# Patient Record
Sex: Female | Born: 1970
Health system: Southern US, Community
[De-identification: ages and names within clinical notes are randomized; demographics above are authoritative.]

## PROBLEM LIST (undated history)

## (undated) DIAGNOSIS — D069 Carcinoma in situ of cervix, unspecified: Secondary | ICD-10-CM

## (undated) DIAGNOSIS — Z801 Family history of malignant neoplasm of trachea, bronchus and lung: Secondary | ICD-10-CM

## (undated) DIAGNOSIS — Z808 Family history of malignant neoplasm of other organs or systems: Secondary | ICD-10-CM

## (undated) DIAGNOSIS — Z803 Family history of malignant neoplasm of breast: Secondary | ICD-10-CM

## (undated) DIAGNOSIS — Z800 Family history of malignant neoplasm of digestive organs: Secondary | ICD-10-CM

## (undated) DIAGNOSIS — Z8042 Family history of malignant neoplasm of prostate: Secondary | ICD-10-CM

## (undated) HISTORY — PX: AUGMENTATION MAMMAPLASTY: SUR837

## (undated) HISTORY — DX: Carcinoma in situ of cervix, unspecified: D06.9

## (undated) HISTORY — PX: LEEP: SHX91

## (undated) HISTORY — DX: Family history of malignant neoplasm of other organs or systems: Z80.8

## (undated) HISTORY — DX: Family history of malignant neoplasm of trachea, bronchus and lung: Z80.1

## (undated) HISTORY — DX: Family history of malignant neoplasm of breast: Z80.3

## (undated) HISTORY — DX: Family history of malignant neoplasm of prostate: Z80.42

## (undated) HISTORY — DX: Family history of malignant neoplasm of digestive organs: Z80.0

## (undated) HISTORY — PX: BREAST SURGERY: SHX581

---

## 1994-04-25 HISTORY — PX: WISDOM TOOTH EXTRACTION: SHX21

## 2009-04-25 HISTORY — PX: AUGMENTATION MAMMAPLASTY: SUR837

## 2010-04-25 HISTORY — PX: LEEP: SHX91

## 2010-04-25 HISTORY — PX: COLPOSCOPY: SHX161

## 2010-04-25 HISTORY — PX: CERVICAL BIOPSY  W/ LOOP ELECTRODE EXCISION: SUR135

## 2014-02-27 ENCOUNTER — Encounter: Payer: Self-pay | Admitting: Family Medicine

## 2014-02-27 ENCOUNTER — Ambulatory Visit (INDEPENDENT_AMBULATORY_CARE_PROVIDER_SITE_OTHER): Payer: BC Managed Care – PPO | Admitting: Family Medicine

## 2014-02-27 VITALS — BP 98/68 | HR 80 | Temp 98.6°F | Ht 65.25 in | Wt 157.1 lb

## 2014-02-27 DIAGNOSIS — Z7189 Other specified counseling: Secondary | ICD-10-CM

## 2014-02-27 DIAGNOSIS — Z7689 Persons encountering health services in other specified circumstances: Secondary | ICD-10-CM

## 2014-02-27 DIAGNOSIS — Z0000 Encounter for general adult medical examination without abnormal findings: Secondary | ICD-10-CM

## 2014-02-27 DIAGNOSIS — Z23 Encounter for immunization: Secondary | ICD-10-CM

## 2014-02-27 DIAGNOSIS — E663 Overweight: Secondary | ICD-10-CM

## 2014-02-27 DIAGNOSIS — Z8742 Personal history of other diseases of the female genital tract: Secondary | ICD-10-CM

## 2014-02-27 NOTE — Patient Instructions (Addendum)
BEFORE YOU LEAVE: -Tdap and flu vaccine  -schedule fasting lab appointment  Yearly gynecological exams and mammograms  Vitamin D3 [phone] IU dialy; 1200mg  calcium from diet +/- supplement only if inadequate intake in diet  We recommend the following healthy lifestyle measures: - eat a healthy diet consisting of lots of vegetables, fruits, beans, nuts, seeds, healthy meats such as white chicken and fish and whole grains.  - avoid fried foods, fast food, processed foods, sodas, red meet and other fattening foods.  - get a least 150 minutes of aerobic exercise per week.

## 2014-02-27 NOTE — Progress Notes (Signed)
HPI:  Alyssa Simpson is here to establish care. Moved to [place] in the last few years.  Last PCP and physical: pap this summer normal - abnormal pap in the past and had LEEP in 2012. She was seeing a gynecologist. She has a family hx of breast cancer and is doing annual mammograms and breast exams with her gynecologist. She has had a mild weight gain the last few years but is working on a healthy diet and regular exercise. ROS negative for unless reported above: fevers, unintentional weight loss, hearing or vision loss, chest pain, palpitations, struggling to breath, hemoptysis, melena, hematochezia, hematuria, falls, loc, si, thoughts of self harm  Past Medical History  Diagnosis Date  . Heavy menstrual bleeding     Past Surgical History  Procedure Laterality Date  . Leep      2012 - reports cervical ca  . Breast surgery      breast augmentation 2011    Family History  Problem Relation Age of Onset  . Hyperlipidemia Father   . Diabetes Father   . Hypertension Father   . Osteoporosis Mother   . Breast cancer Sister   . Thyroid cancer Sister     History   Social History  . Marital Status: Married    Spouse Name: N/A    Number of Children: N/A  . Years of Education: N/A   Social History Main Topics  . Smoking status: Never Smoker   . Smokeless tobacco: None  . Alcohol Use: 0.0 oz/week    0 Not specified per week     Comment: 1-2 drinks most days  . Drug Use: None  . Sexual Activity: None   Other Topics Concern  . None   Social History Narrative   Work or School: Production designer, theatre/television/film      Home Situation: Lives with husband      Spiritual Beliefs: Christian      Lifestyle: exercises at the gym 4 days per week; diet is healthy          Current outpatient prescriptions: norethindrone-ethinyl estradiol-iron (MICROGESTIN FE,GILDESS FE,LOESTRIN FE) 1.5-30 MG-MCG tablet, Take 1 tablet by mouth daily., Disp: , Rfl:   EXAM:  Filed Vitals:   02/27/14 1430   BP: 98/68  Pulse: 80  Temp: 98.6 F (37 C)    Body mass index is 25.95 kg/(m^2).  GENERAL: vitals reviewed and listed above, alert, oriented, appears well hydrated and in no acute distress  HEENT: atraumatic, conjunttiva clear, no obvious abnormalities on inspection of external nose and ears  NECK: no obvious masses on inspection  LUNGS: clear to auscultation bilaterally, no wheezes, rales or rhonchi, good air movement  CV: HRRR, no peripheral edema  MS: moves all extremities without noticeable abnormality  PSYCH: pleasant and cooperative, no obvious depression or anxiety  ASSESSMENT AND PLAN:  Discussed the following assessment and plan:  Encounter to establish care  Overweight  Hx of abnormal cervical Pap smear  Visit for preventive health examination - Plan: Lipid Panel, Hemoglobin A1c  -USPSTF level a and b recs discussed -Tdap and flu shot  -FASTING labs -We reviewed the PMH, PSH, FH, SH, Meds and Allergies. -We provided refills for any medications we will prescribe as needed. -We addressed current concerns per orders and patient instructions. -We have asked for records for pertinent exams, studies, vaccines and notes from previous providers. -We have advised patient to follow up per instructions below.   -Patient advised to return or notify a doctor  immediately if symptoms worsen or persist or new concerns arise.  Patient Instructions  BEFORE YOU LEAVE: -Tdap and flu vaccine  -schedule fasting lab appointment  Yearly gynecological exams and mammograms  Vitamin D3 [phone] IU dialy; 1200mg  calcium from diet +/- supplement only if inadequate intake in diet  We recommend the following healthy lifestyle measures: - eat a healthy diet consisting of lots of vegetables, fruits, beans, nuts, seeds, healthy meats such as white chicken and fish and whole grains.  - avoid fried foods, fast food, processed foods, sodas, red meet and other fattening foods.  - get  a least 150 minutes of aerobic exercise per week.       Colin Benton R.

## 2014-02-27 NOTE — Addendum Note (Signed)
Addended by: Agnes Lawrence on: 02/27/2014 05:07 PM   Modules accepted: Orders

## 2014-02-27 NOTE — Progress Notes (Signed)
Pre visit review using our clinic review tool, if applicable. No additional management support is needed unless otherwise documented below in the visit note. 

## 2014-03-14 ENCOUNTER — Other Ambulatory Visit (INDEPENDENT_AMBULATORY_CARE_PROVIDER_SITE_OTHER): Payer: BC Managed Care – PPO

## 2014-03-14 DIAGNOSIS — Z0000 Encounter for general adult medical examination without abnormal findings: Secondary | ICD-10-CM

## 2014-03-14 LAB — LIPID PANEL
CHOL/HDL RATIO: 4
Cholesterol: 195 mg/dL (ref 0–200)
HDL: 52.2 mg/dL (ref >39.00)
LDL Cholesterol: 128 mg/dL — ABNORMAL HIGH (ref 0–99)
NonHDL: 142.8
Triglycerides: 76 mg/dL (ref 0.0–149.0)
VLDL: 15.2 mg/dL (ref 0.0–40.0)

## 2014-03-14 LAB — HEMOGLOBIN A1C: Hgb A1c MFr Bld: 5.2 % (ref 4.6–6.5)

## 2015-02-19 ENCOUNTER — Telehealth: Payer: Self-pay | Admitting: Internal Medicine

## 2015-02-19 NOTE — Telephone Encounter (Signed)
Patient Name: Alyssa Simpson  DOB: Jun 01, 1970    Initial Comment Caller states she has been exposed to a co-worker with whooping cough, no symptoms.   Nurse Assessment  Nurse: Raphael Gibney, RN, Vanita Ingles Date/Time Eilene Ghazi Time): 02/19/2015 2:05:21 PM  Confirm and document reason for call. If symptomatic, describe symptoms. 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 No cough or fever. Co worker was diagnosed today. Not even working in the same room.  Has the patient traveled out of the country within the last 30 days? ---Not Applicable  Does the patient have any new or worsening symptoms? ---Yes  Will a triage be completed? ---Yes  Related visit to physician within the last 2 weeks? ---No  Does the PT have any chronic conditions? (i.e. diabetes, asthma, etc.) ---No  Did the patient indicate they were pregnant? ---No     Guidelines    Guideline Title Affirmed Question Affirmed Notes  Whooping Cough Exposure [1] Whooping cough EXPOSURE < 22 days ago AND [2] NO cough or difficulty breathing    Final Disposition User   Call PCP within 37 Hours [name], RN, [name] would like call back whether she needs to take antibiotics as she has no symptoms and whether she needs to get pertussis immunization. Does not want to make appt unless necessary.   Referrals  REFERRED TO PCP OFFICE   Disagree/Comply: Disagree  Disagree/Comply Reason: Disagree with instructions

## 2015-02-20 NOTE — Telephone Encounter (Signed)
Coworker whom she was not in same room with had whooping cough 30 days ago with mild symptoms. Alyssa Simpson has not symptoms, is not pregnant, has no chronic diseases and does not have close contact with infants. She had  her Tdap less then 1 year ago. Opted against any prophylactic treatment. Discussed risks, transmission, prevention.

## 2015-02-20 NOTE — Telephone Encounter (Signed)
Dr. Maudie Mercury,  please see message and advise if pt needs antibiotic or what treatment.

## 2015-02-26 ENCOUNTER — Encounter: Payer: Self-pay | Admitting: Obstetrics and Gynecology

## 2015-02-26 ENCOUNTER — Other Ambulatory Visit: Payer: Self-pay

## 2015-02-26 ENCOUNTER — Ambulatory Visit (INDEPENDENT_AMBULATORY_CARE_PROVIDER_SITE_OTHER): Payer: BLUE CROSS/BLUE SHIELD | Admitting: Obstetrics and Gynecology

## 2015-02-26 VITALS — BP 102/60 | HR 80 | Resp 14 | Wt 161.0 lb

## 2015-02-26 DIAGNOSIS — D069 Carcinoma in situ of cervix, unspecified: Secondary | ICD-10-CM | POA: Insufficient documentation

## 2015-02-26 DIAGNOSIS — Z124 Encounter for screening for malignant neoplasm of cervix: Secondary | ICD-10-CM

## 2015-02-26 DIAGNOSIS — R635 Abnormal weight gain: Secondary | ICD-10-CM | POA: Diagnosis not present

## 2015-02-26 DIAGNOSIS — Z01419 Encounter for gynecological examination (general) (routine) without abnormal findings: Secondary | ICD-10-CM

## 2015-02-26 DIAGNOSIS — Z0000 Encounter for general adult medical examination without abnormal findings: Secondary | ICD-10-CM

## 2015-02-26 DIAGNOSIS — Z1231 Encounter for screening mammogram for malignant neoplasm of breast: Secondary | ICD-10-CM

## 2015-02-26 NOTE — Progress Notes (Signed)
Patient ID: Alyssa Simpson, female   DOB: Sep 25, 1970, 44 y.o.   MRN: [id number] 44 y.o. [id number] MarriedCaucasianF here for annual exam.  She used to be on OCP's for cycle control, gained weight on the pill. She has gained 10 lb a year x 3 years. She stopped the pill in February. Menses q month, normal flow. She will stay off the pill. Vasectomy for contraception, no dyspareunia.  Period Cycle (Days): 28 Period Duration (Days): 5 days  Period Pattern: Regular Menstrual Flow: Heavy, Moderate Menstrual Control: Tampon, Maxi pad Dysmenorrhea: None  Patient's last menstrual period was 02/24/2015.          Sexually active: Yes.    The current method of family planning is vasectomy.    Exercising: Yes.    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 She has never used smokeless tobacco. She reports that she drinks about 0.6 - 1.2 oz of alcohol per week. She reports that she does not use illicit drugs. 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 Son is at [employer] state, older son is a IT trainer.   Past Medical History  Diagnosis Date  . Heavy menstrual bleeding     Past Surgical History  Procedure Laterality Date  . Leep      2012 - reports cervical ca  . Breast surgery      breast augmentation 2011  . Colposcopy  2012   . Cervical biopsy  w/ loop electrode excision  2012    Current Outpatient Prescriptions  Medication Sig Dispense Refill  . norethindrone-ethinyl estradiol-iron (MICROGESTIN FE,GILDESS FE,LOESTRIN FE) 1.5-30 MG-MCG tablet Take 1 tablet by mouth daily.     No current facility-administered medications for this visit.    Family History  Problem Relation Age of Onset  . Hyperlipidemia Father   . Diabetes Father    . Hypertension Father   . Osteoporosis Mother   . Breast cancer Sister   . Thyroid cancer Sister   . Heart failure Maternal Uncle     Review of Systems  Constitutional: Positive for unexpected weight change.  HENT: Negative.   Eyes: Negative.   Respiratory: Negative.   Cardiovascular: Negative.   Gastrointestinal: Negative.   Endocrine: Negative.   Genitourinary: Negative.   Musculoskeletal: Negative.   Skin: Negative.   Allergic/Immunologic: Negative.   Neurological: Negative.   Psychiatric/Behavioral: Negative.     Exam:   BP 102/60 mmHg  Pulse 80  Resp 14  Wt 161 lb (73.029 kg)  LMP 02/24/2015  Weight change: @WEIGHTCHANGE @ Height:      Ht Readings from Last 3 Encounters:  02/27/14 5' 5.25" (1.657 m)    General appearance: alert, cooperative and appears stated age Head: Normocephalic, without obvious abnormality, atraumatic Neck: no adenopathy, supple, symmetrical, trachea midline and thyroid normal to inspection and palpation Lungs: clear to auscultation bilaterally Breasts: normal appearance, no masses or tenderness, bilateral implants Heart: regular rate and rhythm Abdomen: soft, non-tender; bowel sounds normal; no masses,  no organomegaly Extremities: extremities normal, atraumatic, no cyanosis or edema Skin: Skin color, texture, turgor normal. No rashes or lesions Lymph nodes: Cervical, supraclavicular, and axillary nodes normal. No abnormal inguinal nodes palpated Neurologic: Grossly  normal   Pelvic: External genitalia:  no lesions              Urethra:  normal appearing urethra with no masses, tenderness or lesions              Bartholins and Skenes: normal                 Vagina: normal appearing vagina with normal color and discharge, no lesions              Cervix: no lesions               Bimanual Exam:  Uterus:  normal size, contour, position, consistency, mobility, non-tender              Adnexa: no mass, fullness, tenderness                Rectovaginal: Confirms               Anus:  normal sphincter tone, no lesions  Chaperone was present for exam.  A:  Well Woman with normal exam  H/O leep in 2012 for CIN 2-3   P:   Pap with hpv  Mammogram  Discussed breast self exams  Discussed calcium and vit D, on calcium now

## 2015-02-26 NOTE — Patient Instructions (Signed)

## 2015-03-02 ENCOUNTER — Ambulatory Visit (INDEPENDENT_AMBULATORY_CARE_PROVIDER_SITE_OTHER): Payer: BLUE CROSS/BLUE SHIELD | Admitting: Family Medicine

## 2015-03-02 ENCOUNTER — Encounter: Payer: Self-pay | Admitting: Family Medicine

## 2015-03-02 ENCOUNTER — Other Ambulatory Visit (INDEPENDENT_AMBULATORY_CARE_PROVIDER_SITE_OTHER): Payer: BLUE CROSS/BLUE SHIELD

## 2015-03-02 VITALS — BP 98/70 | HR 92 | Temp 98.1°F | Ht 65.25 in | Wt 160.2 lb

## 2015-03-02 DIAGNOSIS — Z23 Encounter for immunization: Secondary | ICD-10-CM | POA: Diagnosis not present

## 2015-03-02 DIAGNOSIS — R635 Abnormal weight gain: Secondary | ICD-10-CM

## 2015-03-02 DIAGNOSIS — Z0000 Encounter for general adult medical examination without abnormal findings: Secondary | ICD-10-CM

## 2015-03-02 DIAGNOSIS — J069 Acute upper respiratory infection, unspecified: Secondary | ICD-10-CM

## 2015-03-02 LAB — TSH: TSH: 0.985 u[IU]/mL (ref 0.350–4.500)

## 2015-03-02 LAB — LIPID PANEL
Cholesterol: 169 mg/dL (ref 125–200)
HDL: 71 mg/dL (ref >=46)
LDL CALC: 83 mg/dL (ref <130)
TRIGLYCERIDES: 73 mg/dL (ref <150)
Total CHOL/HDL Ratio: 2.4 Ratio (ref <=5.0)
VLDL: 15 mg/dL (ref <30)

## 2015-03-02 LAB — VITAMIN D 25 HYDROXY (VIT D DEFICIENCY, FRACTURES): Vit D, 25-Hydroxy: 23 ng/mL — ABNORMAL LOW (ref 30–100)

## 2015-03-02 MED ORDER — AZITHROMYCIN 250 MG PO TABS: ORAL_TABLET | ORAL | Status: DC

## 2015-03-02 NOTE — Progress Notes (Signed)
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  Past Medical History  Diagnosis Date  . CIN III (cervical intraepithelial neoplasia III)     Past Surgical History  Procedure Laterality Date  . Leep      2012 - CIN 2-3  . Breast surgery      breast augmentation 2011  . Colposcopy  2012   . Cervical biopsy  w/ loop electrode excision  2012    Family History  Problem Relation Age of Onset  . Hyperlipidemia Father   . Diabetes Father   . Hypertension Father   . Osteoporosis Mother   . Breast cancer Sister   . Thyroid cancer Sister   . Heart failure Maternal Uncle     Social History   Social History  . Marital Status: Married    Spouse Name: N/A  . Number of Children: N/A  . Years of Education: N/A   Social History Main Topics  . Smoking status: Never Smoker   . Smokeless tobacco: Never Used  . Alcohol Use: 0.6 - 1.2 oz/week    1-2 Standard drinks or equivalent per week     Comment: 1-2 drinks most days  . Drug Use: No  . Sexual Activity:    Partners: Male   Other Topics Concern  . None   Social History Narrative   Work or School: Production designer, theatre/television/film      Home Situation: Lives with husband      Spiritual Beliefs: Christian      Lifestyle: exercises at the gym 4 days per week; diet is healthy           Current outpatient prescriptions:  .  Calcium Carbonate-Vitamin D (CALCIUM 500 + D PO), Take by mouth., Disp: , Rfl:  .  Cyanocobalamin (VITAMIN B 12 PO), Take by mouth., Disp: , Rfl:  .  azithromycin (ZITHROMAX) 250 MG tablet, 2  tabs day 1, then one tab daily, Disp: 6 tablet, Rfl: 0  EXAM:  Filed Vitals:   03/02/15 1007  BP: 98/70  Pulse: 92  Temp: 98.1 F (36.7 C)    Body mass index is 26.47 kg/(m^2).  GENERAL: vitals reviewed and listed above, alert, oriented, appears well hydrated and in no acute distress  HEENT: atraumatic, conjunttiva clear, no obvious abnormalities on inspection of external nose and ears, normal appearance of ear canals and TMs, clear nasal congestion, mild post oropharyngeal erythema with PND, no tonsillar edema or exudate, no sinus TTP  NECK: no obvious masses on inspection  LUNGS: clear to auscultation bilaterally, no wheezes, rales or rhonchi, good air movement  CV: HRRR, no peripheral edema  MS: moves all extremities without noticeable abnormality  PSYCH: pleasant and cooperative, no obvious depression or anxiety  ASSESSMENT AND PLAN:  Discussed the following assessment and plan:  Acute upper respiratory infection  -given HPI and exam findings today, a serious infection or illness is unlikely. We discussed potential etiologies, with VURI being most likely, and advised supportive care and monitoring. We discussed treatment side effects, likely course, antibiotic misuse, transmission, and signs of  developing a serious illness. -abx given sinus pain and travel -pertussis very unlikely given not exposed and Tdap in last 1 year, but advised testing given upper resp symptoms - she declined -of course, we advised to return or notify a doctor immediately if symptoms worsen or persist or new concerns arise.    Patient Instructions  INSTRUCTIONS FOR UPPER RESPIRATORY INFECTION:  -plenty of rest and fluids  -nasal saline wash 2-3 times daily (use prepackaged nasal saline or bottled/distilled water if making your own)   -can use AFRIN nasal spray for drainage and nasal congestion - but do NOT use longer then 3-4 days  -can use tylenol (in no history of liver disease) or  ibuprofen (if no history of kidney disease, bowel bleeding or significant heart disease) as directed for aches and sorethroat  -in the winter time, using a humidifier at night is helpful (please follow cleaning instructions)  -if you are taking a cough medication - use only as directed, may also try a teaspoon of honey to coat the throat and throat lozenges. If given a cough medication with codeine or hydrocodone or other narcotic please be advised that this contains a strong and  potentially addicting medication. Please follow instructions carefully, take as little as possible and only use AS NEEDED for severe cough. Discuss potential side effects with your pharmacy. Please do not drive or operate machinery while taking these types of medications. Please do not take other sedating medications, drugs or alcohol while taking this medication without discussing with your doctor.  -for sore throat, salt water gargles can help  -follow up if you have fevers, facial pain, tooth pain, difficulty breathing or are worsening or symptoms persist longer then expected  Upper Respiratory Infection, Adult An upper respiratory infection (URI) is also known as the common cold. It is often caused by a type of germ (virus). Colds are easily spread (contagious). You can pass it to others by kissing, coughing, sneezing, or drinking out of the same glass. Usually, you get better in 1 to 3  weeks.  However, the cough can last for even longer. HOME CARE   Only take medicine as told by your doctor. Follow instructions provided above.  Drink enough water and fluids to keep your pee (urine) clear or pale yellow.  Get plenty of rest.  Return to work when your temperature is < 100 for 24 hours or as told by your doctor. You may use a face mask and wash your hands to stop your cold from spreading. GET HELP RIGHT AWAY IF:   After the first few days, you feel you are getting worse.  You have questions about your  medicine.  You have chills, shortness of breath, or red spit (mucus).  You have pain in the face for more then 1-2 days, especially when you bend forward.  You have a fever, puffy (swollen) neck, pain when you swallow, or white spots in the back of your throat.  You have a bad headache, ear pain, sinus pain, or chest pain.  You have a high-pitched whistling sound when you breathe in and out (wheezing).  You cough up blood.  You have sore muscles or a stiff neck. MAKE SURE YOU:   Understand these instructions.  Will watch your condition.  Will get help right away if you are not doing well or get worse. Document Released: 09/28/2007 Document Revised: 07/04/2011 Document Reviewed: 07/17/2013 [facility] Patient Information 2015 [place], [place]. This information is not intended to replace advice  given to you by your health care provider. Make sure you discuss any questions you have with your health care provider.      Colin Benton R.

## 2015-03-02 NOTE — Progress Notes (Signed)
Pre visit review using our clinic review tool, if applicable. No additional management support is needed unless otherwise documented below in the visit note. 

## 2015-03-02 NOTE — Patient Instructions (Signed)

## 2015-03-03 ENCOUNTER — Telehealth: Payer: Self-pay | Admitting: *Deleted

## 2015-03-03 LAB — IPS PAP TEST WITH HPV

## 2015-03-03 NOTE — Telephone Encounter (Signed)
LMTC for lab results -eh  

## 2015-03-03 NOTE — Telephone Encounter (Signed)
I spoke with patient and gave her lab results-eh

## 2015-03-03 NOTE — Telephone Encounter (Signed)
Patient is returning a call to Elaine. °

## 2015-03-03 NOTE — Telephone Encounter (Signed)
-----   Message from Salvadore Dom, MD sent at 03/03/2015  7:18 AM EST ----- Please inform the patient that her vit D level is low, the rest of her blood work is normal. She should start taking 1,000 IU a day of vit D.

## 2015-03-09 ENCOUNTER — Telehealth: Payer: Self-pay | Admitting: Emergency Medicine

## 2015-03-09 MED ORDER — METRONIDAZOLE 0.75 % VA GEL: 1.0000 | Freq: Two times a day (BID) | VAGINAL | Status: DC

## 2015-03-09 NOTE — Telephone Encounter (Signed)
Message left to return call to [place] at [phone].   02 Recall entered.

## 2015-03-09 NOTE — Telephone Encounter (Signed)
Patient returning call.

## 2015-03-09 NOTE — Telephone Encounter (Signed)
-----   Message from Salvadore Dom, MD sent at 03/06/2015  9:28 AM EST ----- 02 Please inform the patient that her pap was + for BV and if she is symptomatic treat with flagyl (either oral or vaginal, her choice), no ETOH while on Flagyl.  Oral: Flagyl 500 mg BID x 7 days, or Vaginal: Metrogel, 1 applicator per vagina q day x 5 days. If not symptomatic she doesn't need to be treated

## 2015-03-09 NOTE — Telephone Encounter (Signed)
Returned call to patient. She states she has been having some intermittent vaginal discharge and odor. Has been treated for bacterial vaginosis previously.  Requests Metrogel to pharmacy. Instructions given. She verbalized understanding of instructions and will follow up as needed or for any other concerns.Routing to provider for final review. Patient agreeable to disposition. Will close encounter.

## 2015-03-11 ENCOUNTER — Other Ambulatory Visit: Payer: Self-pay

## 2015-03-31 ENCOUNTER — Telehealth: Payer: Self-pay | Admitting: Family Medicine

## 2015-03-31 NOTE — Telephone Encounter (Signed)
Mother: Tyrah Aceves - Provider: Dr. Maudie Mercury - DOB: 9.[birth year].72 Father: Shirlyn Goltz - Provider: Dr. Elease Hashimoto - DOB: [birth year].70  Both are wondering if either Dr. Maudie Mercury or Dr. Elease Hashimoto will take on their daughter Alyssa Simpson, dob: 07-01-95 as a new patient. They think too that she may have Bronchitis. She'll be home from college the evening of 12.7.16. Please give them a call and let them know if this will be possible.  [facility] ph# [phone] Thank you.

## 2015-04-01 NOTE — Telephone Encounter (Signed)
Pt mom is calling back to let us know that appointment is not needed for her daughter she went to [facility].

## 2015-04-01 NOTE — Telephone Encounter (Signed)
See prior note

## 2015-04-01 NOTE — Telephone Encounter (Signed)
Dr. Elease Hashimoto is not accepting any more new patient. Please advise if you would be willing to accept this new patient.

## 2015-04-01 NOTE — Telephone Encounter (Signed)
If she is not ok with seeing one of our providers taking new patients Yong Channel) - Ok with me for NPV in available 30 minute slot.

## 2015-04-01 NOTE — Telephone Encounter (Signed)
I left a message for Laurie that Alyssa Simpson has an appt available tomorrow at 10am if she wants to get Alyssa Simpson in as a new patient. Thanks to all of you.

## 2015-04-01 NOTE — Telephone Encounter (Signed)
FYI

## 2015-04-06 ENCOUNTER — Ambulatory Visit
Admission: RE | Admit: 2015-04-06 | Discharge: 2015-04-06 | Disposition: A | Discharge status: Home / Self Care | Payer: BLUE CROSS/BLUE SHIELD | Source: Ambulatory Visit

## 2015-04-06 DIAGNOSIS — Z1231 Encounter for screening mammogram for malignant neoplasm of breast: Secondary | ICD-10-CM

## 2015-04-07 ENCOUNTER — Encounter: Payer: Self-pay | Admitting: Family Medicine

## 2015-07-01 ENCOUNTER — Encounter (HOSPITAL_COMMUNITY): Payer: Self-pay

## 2015-07-01 ENCOUNTER — Telehealth: Payer: Self-pay | Admitting: Family Medicine

## 2015-07-01 ENCOUNTER — Emergency Department (HOSPITAL_COMMUNITY): Payer: BLUE CROSS/BLUE SHIELD

## 2015-07-01 ENCOUNTER — Emergency Department (HOSPITAL_COMMUNITY)
Admission: EM | Admit: 2015-07-01 | Discharge: 2015-07-02 | Disposition: A | Discharge status: Home / Self Care | Payer: BLUE CROSS/BLUE SHIELD | Attending: Emergency Medicine | Admitting: Emergency Medicine

## 2015-07-01 DIAGNOSIS — Z86001 Personal history of in-situ neoplasm of cervix uteri: Secondary | ICD-10-CM | POA: Insufficient documentation

## 2015-07-01 DIAGNOSIS — Z792 Long term (current) use of antibiotics: Secondary | ICD-10-CM | POA: Diagnosis not present

## 2015-07-01 DIAGNOSIS — R202 Paresthesia of skin: Secondary | ICD-10-CM | POA: Diagnosis not present

## 2015-07-01 DIAGNOSIS — F419 Anxiety disorder, unspecified: Secondary | ICD-10-CM | POA: Insufficient documentation

## 2015-07-01 DIAGNOSIS — R079 Chest pain, unspecified: Secondary | ICD-10-CM | POA: Diagnosis present

## 2015-07-01 DIAGNOSIS — R0602 Shortness of breath: Secondary | ICD-10-CM | POA: Diagnosis not present

## 2015-07-01 DIAGNOSIS — R200 Anesthesia of skin: Secondary | ICD-10-CM | POA: Insufficient documentation

## 2015-07-01 LAB — BASIC METABOLIC PANEL
Anion gap: 9 (ref 5–15)
BUN: 16 mg/dL (ref 6–20)
CALCIUM: 10.1 mg/dL (ref 8.9–10.3)
CO2: 28 mmol/L (ref 22–32)
CREATININE: 0.74 mg/dL (ref 0.44–1.00)
Chloride: 105 mmol/L (ref 101–111)
GFR calc Af Amer: >60 mL/min (ref >60)
Glucose, Bld: 99 mg/dL (ref 65–99)
POTASSIUM: 4.4 mmol/L (ref 3.5–5.1)
SODIUM: 142 mmol/L (ref 135–145)

## 2015-07-01 LAB — CBC
HCT: 42.3 % (ref 36.0–46.0)
Hemoglobin: 13.8 g/dL (ref 12.0–15.0)
MCH: 30.2 pg (ref 26.0–34.0)
MCHC: 32.6 g/dL (ref 30.0–36.0)
MCV: 92.6 fL (ref 78.0–100.0)
PLATELETS: 340 10*3/uL (ref 150–400)
RBC: 4.57 MIL/uL (ref 3.87–5.11)
RDW: 12.8 % (ref 11.5–15.5)
WBC: 9 10*3/uL (ref 4.0–10.5)

## 2015-07-01 LAB — I-STAT TROPONIN, ED
TROPONIN I, POC: 0.01 ng/mL (ref 0.00–0.08)
TROPONIN I, POC: 0 ng/mL (ref 0.00–0.08)

## 2015-07-01 NOTE — Discharge Instructions (Signed)
Please read and follow all provided instructions.  Your diagnoses today include:  1. Chest pain, unspecified chest pain type    Tests performed today include:  An EKG of your heart  A chest x-ray  Cardiac enzymes - a blood test for heart muscle damage  Blood counts and electrolytes  Vital signs. See below for your results today.   Medications prescribed:   None  Take any prescribed medications only as directed.  Follow-up instructions: Please follow-up with the cardiologist listed and your primary care provider as soon as you can for further evaluation of your symptoms.   Return instructions:  SEEK IMMEDIATE MEDICAL ATTENTION IF:  You have severe chest pain, especially if the pain is crushing or pressure-like and spreads to the arms, back, neck, or jaw, or if you have sweating, nausea (feeling sick to your stomach), or shortness of breath. THIS IS AN EMERGENCY. Don't wait to see if the pain will go away. Get medical help at once. Call 911 or 0 (operator). DO NOT drive yourself to the hospital.   Your chest pain gets worse and does not go away with rest.   You have an attack of chest pain lasting longer than usual, despite rest and treatment with the medications your caregiver has prescribed.   You wake from sleep with chest pain or shortness of breath.  You feel dizzy or faint.  You have chest pain not typical of your usual pain for which you originally saw your caregiver.   You have any other emergent concerns regarding your health.  Additional Information: Chest pain comes from many different causes. Your caregiver has diagnosed you as having chest pain that is not specific for one problem, but does not require admission.  You are at low risk for an acute heart condition or other serious illness.   Your vital signs today were: BP 104/75 mmHg   Pulse 83   Temp(Src) 98.7 F (37.1 C) (Oral)   Resp 16   Ht 5\' 7"  (1.702 m)   Wt 70.308 kg   BMI 24.27 kg/m2   SpO2 98%   LMP  06/23/2015 If your blood pressure (BP) was elevated above 135/85 this visit, please have this repeated by your doctor within one month. --------------

## 2015-07-01 NOTE — ED Provider Notes (Signed)
CSN: [id number]     Arrival date & time 07/01/15  1705 History   First MD Initiated Contact with Patient 07/01/15 2240     Chief Complaint  Patient presents with  . Chest Pain     (Consider location/radiation/quality/duration/timing/severity/associated sxs/prior Treatment) HPI Comments: Patient presents with complaint of chest pain 2 over the past 2 days. Patient does not have any significant past medical history. Father had diabetes and angina in his 34s. Patient describes a pressure in her chest lasting approximately 20 seconds after running up a flight of stairs yesterday. She stated that she was scared and had tingling in her arms and shortness of breath for approximately 4-5 minutes afterwards. No associated nausea or vomiting, diaphoresis, radiation of pain, palpitations, lightheadedness or syncope. Today patient was working in her yard and had acute onset of chest pressure again lasting a short period of time. This occurred approximately an hour prior to arrival. Symptoms were similar to the previous day. Patient called her primary care physician who recommended that she go to the emergency department for evaluation. She's never had symptoms like this in the past. She denies fever, cough, abdominal pain or urinary symptoms. No treatments prior to arrival. Patient denies risk factors for pulmonary embolism including: unilateral leg swelling, history of DVT/PE/other blood clots, use of exogenous hormones, recent immobilizations, recent surgery, recent travel (>4hr segment), malignancy, hemoptysis. The onset of this condition was acute. The course is resolved. Aggravating factors: none. Alleviating factors: none.    The history is provided by the patient.    Past Medical History  Diagnosis Date  . CIN III (cervical intraepithelial neoplasia III)    Past Surgical History  Procedure Laterality Date  . Leep      2012 - CIN 2-3  . Breast surgery      breast augmentation 2011  . Colposcopy   2012   . Cervical biopsy  w/ loop electrode excision  2012   Family History  Problem Relation Age of Onset  . Hyperlipidemia Father   . Diabetes Father   . Hypertension Father   . Osteoporosis Mother   . Breast cancer Sister   . Thyroid cancer Sister   . Heart failure Maternal Uncle    Social History  Substance Use Topics  . Smoking status: Never Smoker   . Smokeless tobacco: Never Used  . Alcohol Use: 0.6 - 1.2 oz/week    1-2 Standard drinks or equivalent per week     Comment: 1-2 drinks most days   OB History    Gravida Para Term Preterm AB TAB SAB Ectopic Multiple Living   2 2 2       2      Review of Systems  Constitutional: Negative for fever and diaphoresis.  Eyes: Negative for redness.  Respiratory: Positive for shortness of breath. Negative for cough.   Cardiovascular: Positive for chest pain. Negative for palpitations and leg swelling.  Gastrointestinal: Negative for nausea, vomiting and abdominal pain.  Genitourinary: Negative for dysuria.  Musculoskeletal: Negative for back pain and neck pain.  Skin: Negative for rash.  Neurological: Positive for numbness (tingling in arms). Negative for syncope and light-headedness.  Psychiatric/Behavioral: The patient is nervous/anxious.       Allergies  Poison ivy extract  Home Medications   Prior to Admission medications   Medication Sig Start Date End Date Taking? Authorizing Provider  azithromycin (ZITHROMAX) 250 MG tablet 2 tabs day 1, then one tab daily 03/02/15   Lucretia Kern, DO  Calcium Carbonate-Vitamin D (CALCIUM 500 + D PO) Take by mouth.    Historical Provider, MD  Cyanocobalamin (VITAMIN B 12 PO) Take by mouth.    Historical Provider, MD  metroNIDAZOLE (METROGEL) 0.75 % vaginal gel Place 1 Applicatorful vaginally 2 (two) times daily. Place one applicator full PV at hs for 5 nights. 03/09/15   Megan Salon, MD   BP 104/75 mmHg  Pulse 83  Temp(Src) 98.7 F (37.1 C) (Oral)  Resp 16  Ht 5\' 7"  (1.702 m)   Wt 70.308 kg  BMI 24.27 kg/m2  SpO2 98%  LMP 06/23/2015 Physical Exam  Constitutional: She appears well-developed and well-nourished.  HENT:  Head: Normocephalic and atraumatic.  Mouth/Throat: Mucous membranes are normal. Mucous membranes are not dry.  Eyes: Conjunctivae are normal.  Neck: Trachea normal and normal range of motion. Neck supple. Normal carotid pulses and no JVD present. No muscular tenderness present. Carotid bruit is not present. No tracheal deviation present.  Cardiovascular: Normal rate, regular rhythm, S1 normal, S2 normal, normal heart sounds and intact distal pulses.  Exam reveals no decreased pulses.   No murmur heard. Pulmonary/Chest: Effort normal. No respiratory distress. She has no wheezes. She exhibits no tenderness.  Abdominal: Soft. Normal aorta and bowel sounds are normal. There is no tenderness. There is no rebound and no guarding.  Musculoskeletal: Normal range of motion.  Neurological: She is alert.  Skin: Skin is warm and dry. She is not diaphoretic. No cyanosis. No pallor.  Psychiatric: She has a normal mood and affect.  Nursing note and vitals reviewed.   ED Course  Procedures (including critical care time) Labs Review Labs Reviewed  BASIC METABOLIC PANEL  CBC  I-STAT [place], ED  I-STAT TROPOININ, ED    Imaging Review Dg Chest 2 View  07/01/2015  CLINICAL DATA:  Running up stairs 2 days ago and felt chest heaviness and arm tingling. EXAM: CHEST  2 VIEW COMPARISON:  None. FINDINGS: The heart size and mediastinal contours are within normal limits. Both lungs are clear. The visualized skeletal structures are unremarkable. IMPRESSION: No active cardiopulmonary disease. Electronically Signed   By: Misty Stanley M.D.   On: 07/01/2015 17:57   I have personally reviewed and evaluated these images and lab results as part of my medical decision-making.   EKG Interpretation   Date/Time:  Wednesday July 01 2015 17:19:05 EST Ventricular Rate:   84 PR Interval:  136 QRS Duration: 87 QT Interval:  347 QTC Calculation: 410 R Axis:   47 Text Interpretation:  Sinus rhythm No old tracing to compare Confirmed by  [facility]  MD, ELLIOTT [phone]) on 07/01/2015 8:01:21 PM       Patient seen and examined. Discussed risk factor profile. Second troponin and repeat EKG pending. Plan is to discharge with cardiology follow-up if negative.   Vital signs reviewed and are as follows: BP 104/75 mmHg  Pulse 83  Temp(Src) 98.7 F (37.1 C) (Oral)  Resp 16  Ht 5\' 7"  (1.702 m)  Wt 70.308 kg  BMI 24.27 kg/m2  SpO2 98%  LMP 06/23/2015  12:03 AM second troponin is normal and repeat EKG is unchanged without signs of ischemia. Patient counseled to refrain from any strenuous activities to follow-up with her PCP and cardiologist for consideration of stress testing.  Patient was counseled to return with severe chest pain, especially if the pain is crushing or pressure-like and spreads to the arms, back, neck, or jaw, or if they have sweating, nausea, or shortness of breath  with the pain. They were encouraged to call 911 with these symptoms.   They were also told to return if their chest pain gets worse and does not go away with rest, they have an attack of chest pain lasting longer than usual despite rest and treatment with the medications their caregiver has prescribed, if they wake from sleep with chest pain or shortness of breath, if they feel dizzy or faint, if they have chest pain not typical of their usual pain, or if they have any other emergent concerns regarding their health.  The patient verbalized understanding and agreed.    MDM   Final diagnoses:  Chest pain, unspecified chest pain type   Patient with atypical short-lived chest pain over the past 2 days. Feel patient is low risk for ACS given history (poor story for ACS/MI), negative troponin(s), normal/unchanged EKG. Patient is PERC negative. HEART = 1. Follow-up as above. Patient seems  reliable to return with worsening.    Carlisle Cater, PA-C 07/02/15 0005  Everlene Balls, MD 07/02/15 [phone]

## 2015-07-01 NOTE — Telephone Encounter (Signed)
[facility] - Client [facility] Patient Name: Alyssa Simpson DOB: August 21, 1970 Initial Comment Caller states she was transferred from the office. She was out walking around in her yard and she experienced heavy pressure on her chest. Her hands began to shake after that. This is the second time this week it has happened, first instance was 2 days ago. Nurse Assessment Nurse: Doyle Askew, RN, Joelene Millin Date/Time Eilene Ghazi Time): 07/01/2015 4:20:09 PM Confirm and document reason for call. If symptomatic, describe symptoms. You must click the next button to save text entered. ---Caller states pt has had some chest pain twice in past 2 days. first time was 2 days ago after going up stairs. Then today was walking around the yard and pushing the mower (flat yard) and was moving was pretty fast, she had the pain again. Pressure in the middle of the chest. Hands immediately started shaking and pt got weak. arms got numb first time. Pressure only lasted very short amt of time (less than a minute) but when pressure goes away, heart is racing. Has the patient traveled out of the country within the last 30 days? ---Not Applicable Does the patient have any new or worsening symptoms? ---Yes Will a triage be completed? ---Yes Related visit to physician within the last 2 weeks? ---No Does the PT have any chronic conditions? (i.e. diabetes, asthma, etc.) ---No Is the patient pregnant or possibly pregnant? (Ask all females between the ages of 47-55) ---No Is this a behavioral health or substance abuse call? ---No Guidelines Guideline Title Affirmed Question Affirmed Notes Chest Pain Patient sounds very sick or weak to the triager Final Disposition User Go to ED Now (or PCP triage) Doyle Askew, RN, [facility] - ED Disagree/Comply: Comply

## 2015-07-01 NOTE — ED Notes (Signed)
Pt called for blood draw, no response

## 2015-07-01 NOTE — ED Notes (Signed)
Pt running up stairs x 2 days ago. Pt states she felt a heaviness in chest and arms began tingling.  Pt symptoms went away within a couple of minutes.  Today, outside picking up sticks and working in yard.  Pt had similar symptom.

## 2015-07-27 ENCOUNTER — Encounter: Payer: Self-pay | Admitting: Cardiovascular Disease

## 2015-07-27 ENCOUNTER — Ambulatory Visit (INDEPENDENT_AMBULATORY_CARE_PROVIDER_SITE_OTHER): Payer: BLUE CROSS/BLUE SHIELD | Admitting: Cardiovascular Disease

## 2015-07-27 VITALS — BP 100/58 | HR 78 | Ht 67.0 in | Wt 158.4 lb

## 2015-07-27 DIAGNOSIS — R079 Chest pain, unspecified: Secondary | ICD-10-CM

## 2015-07-27 DIAGNOSIS — R0789 Other chest pain: Secondary | ICD-10-CM

## 2015-07-27 NOTE — Progress Notes (Signed)
Cardiology Office Note   Date:  07/27/2015   ID:  Alyssa Simpson, DOB [birth year], MRN [id number]  PCP:  Lucretia Kern., DO  Cardiologist:   Thayer Headings, MD   Chief Complaint  Patient presents with  . Chest Pain      History of Present Illness: Alyssa Simpson is a 45 y.o. female who presents for chest pain  She reports several episodes of CP, bilateral arm tingling and hand numbness. Occurred when she was taking the laundry up the stairs Last 15 seconds.    Resumed her work without any problems 4 days later, she was mowing the lawn  - same symptoms   Perhaps 15 seconds .   Symptoms resolved and she had no further episode  Went to the ER   She walks every other day without any problems.  Has not pushed herself since that time .   Has gained a little weight over the past several years .   Past Medical History  Diagnosis Date  . CIN III (cervical intraepithelial neoplasia III)     Past Surgical History  Procedure Laterality Date  . Leep      2012 - CIN 2-3  . Breast surgery      breast augmentation 2011  . Colposcopy  2012   . Cervical biopsy  w/ loop electrode excision  2012     Current Outpatient Prescriptions  Medication Sig Dispense Refill  . Calcium Carbonate-Vitamin D (CALCIUM 500 + D PO) Take by mouth.    . Cyanocobalamin (VITAMIN B 12 PO) Take by mouth.     No current facility-administered medications for this visit.    Allergies:   Poison ivy extract    Social History:  The patient  reports that she has never smoked. She has never used smokeless tobacco. She reports that she drinks about 0.6 - 1.2 oz of alcohol per week. She reports that she does not use illicit drugs.   Family History:  The patient's family history includes Breast cancer in her sister; Diabetes in her father; Heart failure in her maternal uncle; Hyperlipidemia in her father; Hypertension in her father; Osteoporosis in her mother; Thyroid cancer in her sister.    ROS:  Please see  the history of present illness.    Review of Systems: Constitutional:  denies fever, chills, diaphoresis, appetite change and fatigue.  HEENT: denies photophobia, eye pain, redness, hearing loss, ear pain, congestion, sore throat, rhinorrhea, sneezing, neck pain, neck stiffness and tinnitus.  Respiratory: denies SOB, DOE, cough, chest tightness, and wheezing.  Cardiovascular: admits to chest pain,  Denies  palpitations and leg swelling.  Gastrointestinal: denies nausea, vomiting, abdominal pain, diarrhea, constipation, blood in stool.  Genitourinary: denies dysuria, urgency, frequency, hematuria, flank pain and difficulty urinating.  Musculoskeletal: denies  myalgias, back pain, joint swelling, arthralgias and gait problem.   Skin: denies pallor, rash and wound.  Neurological: denies dizziness, seizures, syncope, weakness, light-headedness, numbness and headaches.   Hematological: denies adenopathy, easy bruising, personal or family bleeding history.  Psychiatric/ Behavioral: denies suicidal ideation, mood changes, confusion, nervousness, sleep disturbance and agitation.       All other systems are reviewed and negative.    PHYSICAL EXAM: VS:  BP 100/58 mmHg  Pulse 78  Ht 5\' 7"  (1.702 m)  Wt 158 lb 6.4 oz (71.85 kg)  BMI 24.80 kg/m2  LMP 06/23/2015 , BMI Body mass index is 24.8 kg/(m^2). GEN: Well nourished, well developed, in no acute distress HEENT: normal  Neck: no JVD, carotid bruits, or masses Cardiac: RRR; no murmurs, rubs, or gallops,no edema  Respiratory:  clear to auscultation bilaterally, normal work of breathing GI: soft, nontender, nondistended, + BS MS: no deformity or atrophy Skin: warm and dry, no rash Neuro:  Strength and sensation are intact Psych: normal   EKG:  EKG is not ordered today. The ekg ordered July 01, 2015:  demonstrates NSR at 69 and is normal    Recent Labs: 03/02/2015: TSH 0.985 07/01/2015: BUN 16; Creatinine, Ser 0.74; Hemoglobin 13.8;  Platelets 340; Potassium 4.4; Sodium 142    Lipid Panel    Component Value Date/Time   CHOL 169 03/02/2015 0847   TRIG 73 03/02/2015 0847   HDL 71 03/02/2015 0847   CHOLHDL 2.4 03/02/2015 0847   VLDL 15 03/02/2015 0847   LDLCALC 83 03/02/2015 0847      Wt Readings from Last 3 Encounters:  07/27/15 158 lb 6.4 oz (71.85 kg)  07/01/15 155 lb (70.308 kg)  03/02/15 160 lb 3.2 oz (72.666 kg)      Other studies Reviewed: Additional studies/ records that were reviewed today include: . Review of the above records demonstrates:    ASSESSMENT AND PLAN:  1.  Chest pain :   Alyssa Simpson presents with several episodes of CP that occurred with exertion.   Has occurred twice     The pain was a mid sternal tightness , just above her breasts.    Lasted for 15 seconds.  Has not recurred in a while  Will get a stress echo for further eval. Will see her as needed.    Current medicines are reviewed at length with the patient today.  The patient does not have concerns regarding medicines.  The following changes have been made:  no change  Labs/ tests ordered today include:   Orders Placed This Encounter  Procedures  . Echo stress     Disposition:   FU with me as needed      Bulah Lurie, Wonda Cheng, MD  07/27/2015 10:26 AM    [facility] [address] [place], [place], [place]  [address] Phone: [phone]; Fax: [phone]   [facility]  [address] [place], [place]  [address] [phone]   Fax [phone]

## 2015-07-27 NOTE — Patient Instructions (Signed)
Medication Instructions:  Your physician recommends that you continue on your current medications as directed. Please refer to the Current Medication list given to you today.   Labwork: None Ordered   Testing/Procedures: Your physician has requested that you have a stress echocardiogram. For further information please visit [email or web address]. Please follow instruction sheet as given.   Follow-Up: Your physician recommends that you schedule a follow-up appointment in: as needed with Dr. Acie Fredrickson.    If you need a refill on your cardiac medications before your next appointment, please call your pharmacy.   Thank you for choosing [facility]! Christen Bame, RN [phone]

## 2015-08-10 ENCOUNTER — Telehealth (HOSPITAL_COMMUNITY): Payer: Self-pay | Admitting: *Deleted

## 2015-08-10 NOTE — Telephone Encounter (Signed)
Patient given detailed instructions per Stress Test Requisition Sheet for test on 08/12/15 at 2:30.Patient Notified to arrive 30 minutes early, and that it is imperative to arrive on time for appointment to keep from having the test rescheduled.  Patient verbalized understanding. Alyssa Simpson

## 2015-08-12 ENCOUNTER — Ambulatory Visit (HOSPITAL_COMMUNITY): Payer: BLUE CROSS/BLUE SHIELD | Attending: Cardiovascular Disease

## 2015-08-12 ENCOUNTER — Ambulatory Visit (HOSPITAL_BASED_OUTPATIENT_CLINIC_OR_DEPARTMENT_OTHER): Payer: BLUE CROSS/BLUE SHIELD

## 2015-08-12 DIAGNOSIS — R079 Chest pain, unspecified: Secondary | ICD-10-CM | POA: Diagnosis not present

## 2016-03-03 ENCOUNTER — Encounter: Payer: Self-pay | Admitting: Obstetrics and Gynecology

## 2016-03-03 ENCOUNTER — Ambulatory Visit (INDEPENDENT_AMBULATORY_CARE_PROVIDER_SITE_OTHER): Payer: BLUE CROSS/BLUE SHIELD | Admitting: Obstetrics and Gynecology

## 2016-03-03 VITALS — BP 98/60 | HR 84 | Resp 14 | Ht 65.75 in | Wt 153.0 lb

## 2016-03-03 DIAGNOSIS — Z01419 Encounter for gynecological examination (general) (routine) without abnormal findings: Secondary | ICD-10-CM | POA: Diagnosis not present

## 2016-03-03 DIAGNOSIS — E559 Vitamin D deficiency, unspecified: Secondary | ICD-10-CM | POA: Diagnosis not present

## 2016-03-03 NOTE — Patient Instructions (Signed)

## 2016-03-03 NOTE — Progress Notes (Signed)
45 y.o. [id number] MarriedCaucasianF here for annual exam.   She is starting to get some hot flashes at night, not every night, occurs randomly.  Period Cycle (Days): 28 Period Duration (Days): 5 days  Period Pattern: Regular Menstrual Flow: Heavy, Moderate Menstrual Control: Tampon, Maxi pad Menstrual Control Change Freq (Hours): changes pad and tampon every 1.5 hrs on heavy days  (minipad). Has been this heavy for years. Has been like this since she stopped pills, on the pills she had headaches during the placebo week. Not anemic.  Sexually active without pain.  Patient's last menstrual period was 03/01/2016.          Sexually active: Yes.    The current method of family planning is vasectomy.    Exercising: Yes.    walking Smoker:  no  Health Maintenance: Pap:  02-26-15 WNL NEG HR HPV, normal pap in 2014 History of abnormal Pap:  Yes LEEP 2012 MMG:  04-06-15 WNL Colonoscopy:  Never BMD:   Never TDaP:  02-27-14 Gardasil: N/A   reports that she has never smoked. She has never used smokeless tobacco. She reports that she drinks about 0.6 - 1.2 oz of alcohol per week . She reports that she does not use drugs. 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 Son is at [employer] state, older son is a IT trainer. Husband is a Theme park manager.   Past Medical History:  Diagnosis Date  . CIN III (cervical intraepithelial neoplasia III)     Past Surgical History:  Procedure Laterality Date  . BREAST SURGERY     breast augmentation 2011  . CERVICAL BIOPSY  W/ LOOP ELECTRODE EXCISION  2012  . COLPOSCOPY  2012   . LEEP     2012 - CIN 2-3    No current outpatient prescriptions on file.   No current facility-administered medications for this visit.     Family History  Problem Relation Age of Onset  . Hyperlipidemia Father   . Diabetes Father   . Hypertension Father   . Osteoporosis Mother   . Breast cancer Sister   . Thyroid cancer Sister   . Heart failure Maternal Uncle     Review of  Systems  Constitutional: Negative.   HENT: Negative.   Eyes: Negative.   Respiratory: Negative.   Cardiovascular: Negative.        Chest "tightness"  Gastrointestinal: Negative.   Endocrine: Negative.   Genitourinary: Positive for menstrual problem.       Heavy menstrual bleeding  Musculoskeletal: Negative.   Skin: Negative.   Allergic/Immunologic: Negative.   Neurological: Negative.   Psychiatric/Behavioral: Negative.   She was seen in the ER for chest tightness and tingling in her fingers, felt low energy. Negative evaluation. She saw a Film/video editor.   Exam:   BP 98/60 (BP Location: Right Arm, Patient Position: Sitting, Cuff Size: Normal)   Pulse 84   Resp 14   Ht 5' 5.75" (1.67 m)   Wt 153 lb (69.4 kg)   LMP 03/01/2016   BMI 24.88 kg/m   Weight change: @WEIGHTCHANGE @ Height:   Height: 5' 5.75" (167 cm)  Ht Readings from Last 3 Encounters:  03/03/16 5' 5.75" (1.67 m)  07/27/15 5\' 7"  (1.702 m)  07/01/15 5\' 7"  (1.702 m)    General appearance: alert, cooperative and appears stated age Head: Normocephalic, without obvious abnormality, atraumatic Neck: no adenopathy, supple, symmetrical, trachea midline and thyroid normal to inspection and palpation Lungs: clear to auscultation bilaterally Breasts: normal appearance,  no masses or tenderness, bilateral implants Heart: regular rate and rhythm Abdomen: soft, non-tender; bowel sounds normal; no masses,  no organomegaly Extremities: extremities normal, atraumatic, no cyanosis or edema Skin: Skin color, texture, turgor normal. No rashes or lesions Lymph nodes: Cervical, supraclavicular, and axillary nodes normal. No abnormal inguinal nodes palpated Neurologic: Grossly normal   Pelvic: External genitalia:  no lesions              Urethra:  normal appearing urethra with no masses, tenderness or lesions              Bartholins and Skenes: normal                 Vagina: normal appearing vagina with normal color and discharge, no  lesions              Cervix: no lesions               Bimanual Exam:  Uterus:  normal size, contour, position, consistency, mobility, non-tender, retroverted              Adnexa: no mass, fullness, tenderness               Rectovaginal: Confirms               Anus:  normal sphincter tone, no lesions  Chaperone was present for exam.  A:  Well Woman with normal exam  Heavy cycles, not anemic, tolerable, discussed options of OCP's, endometrial ablation and IUD  H/O vit d def, stopped taking her supplement  P:   No pap this year  Discussed breast self exam  Discussed calcium and vit D intake  Labs with primary MD  Vit D  Normal lipids in 11/16  Mammogram in 12/17

## 2016-03-04 LAB — VITAMIN D 25 HYDROXY (VIT D DEFICIENCY, FRACTURES): VIT D 25 HYDROXY: 22 ng/mL — AB (ref 30–100)

## 2016-03-07 ENCOUNTER — Other Ambulatory Visit: Payer: Self-pay | Admitting: Obstetrics and Gynecology

## 2016-03-07 DIAGNOSIS — Z1231 Encounter for screening mammogram for malignant neoplasm of breast: Secondary | ICD-10-CM

## 2016-04-08 ENCOUNTER — Ambulatory Visit
Admission: RE | Admit: 2016-04-08 | Discharge: 2016-04-08 | Disposition: A | Discharge status: Home / Self Care | Payer: BLUE CROSS/BLUE SHIELD | Source: Ambulatory Visit | Attending: Obstetrics and Gynecology | Admitting: Obstetrics and Gynecology

## 2016-04-08 DIAGNOSIS — Z1231 Encounter for screening mammogram for malignant neoplasm of breast: Secondary | ICD-10-CM

## 2016-06-08 DIAGNOSIS — D3141 Benign neoplasm of right ciliary body: Secondary | ICD-10-CM | POA: Diagnosis not present

## 2016-12-06 DIAGNOSIS — D3141 Benign neoplasm of right ciliary body: Secondary | ICD-10-CM | POA: Diagnosis not present

## 2017-01-12 ENCOUNTER — Encounter: Payer: Self-pay | Admitting: Family Medicine

## 2017-03-08 ENCOUNTER — Ambulatory Visit: Payer: BLUE CROSS/BLUE SHIELD | Admitting: Obstetrics and Gynecology

## 2017-03-13 ENCOUNTER — Encounter: Payer: Self-pay | Admitting: Obstetrics and Gynecology

## 2017-03-13 ENCOUNTER — Ambulatory Visit (INDEPENDENT_AMBULATORY_CARE_PROVIDER_SITE_OTHER): Payer: BLUE CROSS/BLUE SHIELD | Admitting: Obstetrics and Gynecology

## 2017-03-13 ENCOUNTER — Other Ambulatory Visit: Payer: Self-pay

## 2017-03-13 ENCOUNTER — Other Ambulatory Visit: Payer: Self-pay | Admitting: Obstetrics and Gynecology

## 2017-03-13 VITALS — BP 98/58 | HR 72 | Resp 14 | Ht 66.0 in | Wt 137.0 lb

## 2017-03-13 DIAGNOSIS — Z1231 Encounter for screening mammogram for malignant neoplasm of breast: Secondary | ICD-10-CM

## 2017-03-13 DIAGNOSIS — Z803 Family history of malignant neoplasm of breast: Secondary | ICD-10-CM | POA: Diagnosis not present

## 2017-03-13 DIAGNOSIS — Z01419 Encounter for gynecological examination (general) (routine) without abnormal findings: Secondary | ICD-10-CM

## 2017-03-13 DIAGNOSIS — Z0000 Encounter for general adult medical examination without abnormal findings: Secondary | ICD-10-CM | POA: Diagnosis not present

## 2017-03-13 DIAGNOSIS — E559 Vitamin D deficiency, unspecified: Secondary | ICD-10-CM

## 2017-03-13 NOTE — Progress Notes (Signed)
46 y.o. [id number] MarriedCaucasianF here for annual exam.  Her 43 year old son died this year, died in his sleep. No dyspareunia.  Period Cycle (Days): 28 Period Duration (Days): 5 days  Period Pattern: Regular Menstrual Flow: Heavy, Moderate Menstrual Control: Tampon, Panty liner Menstrual Control Change Freq (Hours): changes tampon every 2 hours on heavy days  Dysmenorrhea: (!) Mild Dysmenorrhea Symptoms: Cramping  Patient's last menstrual period was 03/09/2017.          Sexually active: Yes.    The current method of family planning is vasectomy.    Exercising: Yes.    walking Smoker:  no  Health Maintenance: Pap:  02-26-15 WNL NEG HR HPV 10-16-12 WNL NEG HR HPV History of abnormal Pap:  Yes -2012 LEEP  MMG:  04-08-16 WNL  Colonoscopy:  Never BMD:   2014 normal per patient  TDaP:  Up to date -PCP   Gardasil: N/A   reports that  has never smoked. she has never used smokeless tobacco. She reports that she drinks about 3.0 oz of alcohol per week. She reports that she does not use drugs.20 year old son died, fire figher. Other son is 90. 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  Past Medical History:  Diagnosis Date  . CIN III (cervical intraepithelial neoplasia III)     Past Surgical History:  Procedure Laterality Date  . BREAST SURGERY     breast augmentation 2011  . CERVICAL BIOPSY  W/ LOOP ELECTRODE EXCISION  2012  . COLPOSCOPY  2012   . LEEP     2012 - CIN 2-3    No current outpatient medications on file.   No current facility-administered medications for this visit.     Family History  Problem Relation Age of Onset  . Hyperlipidemia Father   . Diabetes Father   . Hypertension Father   . Osteoporosis Mother   . Breast cancer Sister   . Thyroid cancer Sister   . Heart failure Maternal Uncle   Sister with breast cancer at 104, thyroid cancer at 54.   Review of Systems  Constitutional: Negative.   HENT: Negative.   Eyes: Negative.   Respiratory:  Negative.   Cardiovascular: Negative.   Gastrointestinal: Negative.   Endocrine: Negative.   Genitourinary: Negative.   Musculoskeletal: Negative.   Skin: Negative.   Allergic/Immunologic: Negative.   Neurological: Negative.   Psychiatric/Behavioral:       Sadness- loss of son this year     Exam:   BP (!) 98/58 (BP Location: Right Arm, Patient Position: Sitting, Cuff Size: Normal)   Pulse 72   Resp 14   Ht 5\' 6"  (1.676 m)   Wt 137 lb (62.1 kg)   LMP 03/09/2017   BMI 22.11 kg/m   Weight change: @WEIGHTCHANGE @ Height:   Height: 5\' 6"  (167.6 cm)  Ht Readings from Last 3 Encounters:  03/13/17 5\' 6"  (1.676 m)  03/03/16 5' 5.75" (1.67 m)  07/27/15 5\' 7"  (1.702 m)    General appearance: alert, cooperative and appears stated age Head: Normocephalic, without obvious abnormality, atraumatic Neck: no adenopathy, supple, symmetrical, trachea midline and thyroid normal to inspection and palpation Lungs: clear to auscultation bilaterally Cardiovascular: regular rate and rhythm Breasts: normal appearance, no masses or tenderness Abdomen: soft, non-tender; non distended,  no masses,  no organomegaly Extremities: extremities normal, atraumatic, no cyanosis or edema Skin: Skin color, texture, turgor normal. No rashes or lesions Lymph nodes: Cervical, supraclavicular, and axillary nodes normal. No abnormal  inguinal nodes palpated Neurologic: Grossly normal   Pelvic: External genitalia:  no lesions              Urethra:  normal appearing urethra with no masses, tenderness or lesions              Bartholins and Skenes: normal                 Vagina: normal appearing vagina with normal color and discharge, no lesions              Cervix: no lesions               Bimanual Exam:  Uterus:  normal size, contour, position, consistency, mobility, non-tender              Adnexa: no mass, fullness, tenderness               Rectovaginal: Confirms               Anus:  normal sphincter tone, no  lesions  Chaperone was present for exam.  A:  Well Woman with normal exam  Sister with breast cancer at 63, she will check if she had genetic testing. Once we have more information will do a breast cancer risk model   Vit d def  Grief from loss of son this year. She has great support and is seeing a counselor  P:   Pap next year  Screening labs including vit d  Mammogram yearly, BSE  Discussed breast self exam  Discussed calcium and vit D intake

## 2017-03-13 NOTE — Patient Instructions (Addendum)
EXERCISE AND DIET:  We recommended that you start or continue a regular exercise program for good health. Regular exercise means any activity that makes your heart beat faster and makes you sweat.  We recommend exercising at least 30 minutes per day at least 3 days a week, preferably 4 or 5.  We also recommend a diet low in fat and sugar.  Inactivity, poor dietary choices and obesity can cause diabetes, heart attack, stroke, and kidney damage, among others.    ALCOHOL AND SMOKING:  Women should limit their alcohol intake to no more than 7 drinks/beers/glasses of wine (combined, not each!) per week. Moderation of alcohol intake to this level decreases your risk of breast cancer and liver damage. And of course, no recreational drugs are part of a healthy lifestyle.  And absolutely no smoking or even second hand smoke. Most people know smoking can cause heart and lung diseases, but did you know it also contributes to weakening of your bones? Aging of your skin?  Yellowing of your teeth and nails?  CALCIUM AND VITAMIN D:  Adequate intake of calcium and Vitamin D are recommended.  The recommendations for exact amounts of these supplements seem to change often, but generally speaking 600 mg of calcium (either carbonate or citrate) and 800 units of Vitamin D per day seems prudent. Certain women may benefit from higher intake of Vitamin D.  If you are among these women, your doctor will have told you during your visit.    PAP SMEARS:  Pap smears, to check for cervical cancer or precancers,  have traditionally been done yearly, although recent scientific advances have shown that most women can have pap smears less often.  However, every woman still should have a physical exam from her gynecologist every year. It will include a breast check, inspection of the vulva and vagina to check for abnormal growths or skin changes, a visual exam of the cervix, and then an exam to evaluate the size and shape of the uterus and  ovaries.  And after 46 years of age, a rectal exam is indicated to check for rectal cancers. We will also provide age appropriate advice regarding health maintenance, like when you should have certain vaccines, screening for sexually transmitted diseases, bone density testing, colonoscopy, mammograms, etc.   MAMMOGRAMS:  All women over 40 years old should have a yearly mammogram. Many facilities now offer a "3D" mammogram, which may cost around $50 extra out of pocket. If possible,  we recommend you accept the option to have the 3D mammogram performed.  It both reduces the number of women who will be called back for extra views which then turn out to be normal, and it is better than the routine mammogram at detecting truly abnormal areas.    COLONOSCOPY:  Colonoscopy to screen for colon cancer is recommended for all women at age 50.  We know, you hate the idea of the prep.  We agree, BUT, having colon cancer and not knowing it is worse!!  Colon cancer so often starts as a polyp that can be seen and removed at colonscopy, which can quite literally save your life!  And if your first colonoscopy is normal and you have no family history of colon cancer, most women don't have to have it again for 10 years.  Once every ten years, you can do something that may end up saving your life, right?  We will be happy to help you get it scheduled when you are ready.    Be sure to check your insurance coverage so you understand how much it will cost.  It may be covered as a preventative service at no cost, but you should check your particular policy.      Breast Self-Awareness Breast self-awareness means being familiar with how your breasts look and feel. It involves checking your breasts regularly and reporting any changes to your health care provider. Practicing breast self-awareness is important. A change in your breasts can be a sign of a serious medical problem. Being familiar with how your breasts look and feel allows  you to find any problems early, when treatment is more likely to be successful. All women should practice breast self-awareness, including women who have had breast implants. How to do a breast self-exam One way to learn what is normal for your breasts and whether your breasts are changing is to do a breast self-exam. To do a breast self-exam: Look for Changes  1. Remove all the clothing above your waist. 2. Stand in front of a mirror in a room with good lighting. 3. Put your hands on your hips. 4. Push your hands firmly downward. 5. Compare your breasts in the mirror. Look for differences between them (asymmetry), such as: ? Differences in shape. ? Differences in size. ? Puckers, dips, and bumps in one breast and not the other. 6. Look at each breast for changes in your skin, such as: ? Redness. ? Scaly areas. 7. Look for changes in your nipples, such as: ? Discharge. ? Bleeding. ? Dimpling. ? Redness. ? A change in position. Feel for Changes  Carefully feel your breasts for lumps and changes. It is best to do this while lying on your back on the floor and again while sitting or standing in the shower or tub with soapy water on your skin. Feel each breast in the following way:  Place the arm on the side of the breast you are examining above your head.  Feel your breast with the other hand.  Start in the nipple area and make  inch (2 cm) overlapping circles to feel your breast. Use the pads of your three middle fingers to do this. Apply light pressure, then medium pressure, then firm pressure. The light pressure will allow you to feel the tissue closest to the skin. The medium pressure will allow you to feel the tissue that is a little deeper. The firm pressure will allow you to feel the tissue close to the ribs.  Continue the overlapping circles, moving downward over the breast until you feel your ribs below your breast.  Move one finger-width toward the center of the body.  Continue to use the  inch (2 cm) overlapping circles to feel your breast as you move slowly up toward your collarbone.  Continue the up and down exam using all three pressures until you reach your armpit.  Write Down What You Find  Write down what is normal for each breast and any changes that you find. Keep a written record with breast changes or normal findings for each breast. By writing this information down, you do not need to depend only on memory for size, tenderness, or location. Write down where you are in your menstrual cycle, if you are still menstruating. If you are having trouble noticing differences in your breasts, do not get discouraged. With time you will become more familiar with the variations in your breasts and more comfortable with the exam. How often should I examine my breasts? Examine   your breasts every month. If you are breastfeeding, the best time to examine your breasts is after a feeding or after using a breast pump. If you menstruate, the best time to examine your breasts is 5-7 days after your period is over. During your period, your breasts are lumpier, and it may be more difficult to notice changes. When should I see my health care provider? See your health care provider if you notice:  A change in shape or size of your breasts or nipples.  A change in the skin of your breast or nipples, such as a reddened or scaly area.  Unusual discharge from your nipples.  A lump or thick area that was not there before.  Pain in your breasts.  Anything that concerns you.  This information is not intended to replace advice given to you by your health care provider. Make sure you discuss any questions you have with your health care provider. Document Released: 04/11/2005 Document Revised: 09/17/2015 Document Reviewed: 03/01/2015 Elsevier Interactive Patient Education  2018 [employer]  

## 2017-03-14 LAB — CBC
HEMATOCRIT: 40.5 % (ref 34.0–46.6)
HEMOGLOBIN: 13.7 g/dL (ref 11.1–15.9)
MCH: 31.4 pg (ref 26.6–33.0)
MCHC: 33.8 g/dL (ref 31.5–35.7)
MCV: 93 fL (ref 79–97)
Platelets: 295 10*3/uL (ref 150–379)
RBC: 4.36 x10E6/uL (ref 3.77–5.28)
RDW: 13.6 % (ref 12.3–15.4)
WBC: 4.9 10*3/uL (ref 3.4–10.8)

## 2017-03-14 LAB — LIPID PANEL
CHOL/HDL RATIO: 2.5 ratio (ref 0.0–4.4)
CHOLESTEROL TOTAL: 214 mg/dL — AB (ref 100–199)
HDL: 85 mg/dL (ref >39)
LDL CALC: 114 mg/dL — AB (ref 0–99)
Triglycerides: 76 mg/dL (ref 0–149)
VLDL Cholesterol Cal: 15 mg/dL (ref 5–40)

## 2017-03-14 LAB — COMPREHENSIVE METABOLIC PANEL
ALBUMIN: 4.4 g/dL (ref 3.5–5.5)
ALK PHOS: 68 IU/L (ref 39–117)
ALT: 10 IU/L (ref 0–32)
AST: 15 IU/L (ref 0–40)
Albumin/Globulin Ratio: 2.3 — ABNORMAL HIGH (ref 1.2–2.2)
BUN / CREAT RATIO: 17 (ref 9–23)
BUN: 14 mg/dL (ref 6–24)
Bilirubin Total: 0.4 mg/dL (ref 0.0–1.2)
CO2: 24 mmol/L (ref 20–29)
CREATININE: 0.82 mg/dL (ref 0.57–1.00)
Calcium: 9.2 mg/dL (ref 8.7–10.2)
Chloride: 102 mmol/L (ref 96–106)
GFR calc Af Amer: 99 mL/min/{1.73_m2} (ref >59)
GFR, EST NON AFRICAN AMERICAN: 86 mL/min/{1.73_m2} (ref >59)
Globulin, Total: 1.9 g/dL (ref 1.5–4.5)
Glucose: 85 mg/dL (ref 65–99)
Potassium: 4.2 mmol/L (ref 3.5–5.2)
Sodium: 141 mmol/L (ref 134–144)
Total Protein: 6.3 g/dL (ref 6.0–8.5)

## 2017-03-14 LAB — VITAMIN D 25 HYDROXY (VIT D DEFICIENCY, FRACTURES): VIT D 25 HYDROXY: 22.3 ng/mL — AB (ref 30.0–100.0)

## 2017-04-06 ENCOUNTER — Ambulatory Visit: Payer: BLUE CROSS/BLUE SHIELD | Admitting: Obstetrics and Gynecology

## 2017-04-11 ENCOUNTER — Ambulatory Visit
Admission: RE | Admit: 2017-04-11 | Discharge: 2017-04-11 | Disposition: A | Discharge status: Home / Self Care | Payer: BLUE CROSS/BLUE SHIELD | Source: Ambulatory Visit | Attending: Obstetrics and Gynecology | Admitting: Obstetrics and Gynecology

## 2017-04-11 DIAGNOSIS — Z1231 Encounter for screening mammogram for malignant neoplasm of breast: Secondary | ICD-10-CM

## 2017-05-04 ENCOUNTER — Encounter: Payer: Self-pay | Admitting: Family Medicine

## 2017-06-06 DIAGNOSIS — D3141 Benign neoplasm of right ciliary body: Secondary | ICD-10-CM | POA: Diagnosis not present

## 2017-06-12 DIAGNOSIS — D224 Melanocytic nevi of scalp and neck: Secondary | ICD-10-CM | POA: Diagnosis not present

## 2017-06-12 DIAGNOSIS — D485 Neoplasm of uncertain behavior of skin: Secondary | ICD-10-CM | POA: Diagnosis not present

## 2017-06-12 DIAGNOSIS — L814 Other melanin hyperpigmentation: Secondary | ICD-10-CM | POA: Diagnosis not present

## 2018-03-26 NOTE — Progress Notes (Signed)
47 y.o. [id number] Married White or Caucasian Not Hispanic or Latino female here for annual exam.   Period Duration (Days): 5 days Period Pattern: (!) Irregular Menstrual Flow: Heavy Menstrual Control: Panty liner, Tampon Menstrual Control Change Freq (Hours): changes tampon every 2 hours Dysmenorrhea: (!) Moderate Dysmenorrhea Symptoms: Cramping  Her son died in 08/06/2016 at 78. She is in counseling, helpful. She has good support. Her other son is suffering, he is a 54 year old senior at [employer]. Trying to maintain school and a job.  She is eating better.  She has started having hot flashes & night sweats. She is up ~3 x a night feeling very warm. Not sleeping well.  Cycles are irregular, last one in 8/19, prior to that they were getting further and further apart.    Patient's last menstrual period was 11/30/2017 (exact date).          Sexually active: Yes.    The current method of family planning is spouse has had a vasectomy.    Exercising: Yes.    walking, jogging Smoker:  no  Health Maintenance: Pap:  02-26-15 WNL NEG HR HPV, 10-16-12 WNL NEG HR HPV History of abnormal Pap:  Yes -2010-08-07 LEEP  MMG:  04/11/2017 Birads 1 negative Colonoscopy:  Never BMD:   06-Aug-2012 normal per patient  TDaP:  02/27/2014 Gardasil: N/A   reports that she has never smoked. She has never used smokeless tobacco. She reports that she drinks about 2.0 standard drinks of alcohol per week. She reports that she does not use drugs. Younger son died last year at 56. Other son is 32, senior in college, getting a degree in business administration. 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   Past Medical History:  Diagnosis Date  . CIN III (cervical intraepithelial neoplasia III)     Past Surgical History:  Procedure Laterality Date  . AUGMENTATION MAMMAPLASTY    . BREAST SURGERY     breast augmentation Aug 06, 2009  . CERVICAL BIOPSY  W/ LOOP ELECTRODE EXCISION  August 07, 2010  . COLPOSCOPY  08/07/10   . LEEP     08/07/10 - CIN 2-3     No current outpatient medications on file.   No current facility-administered medications for this visit.     Family History  Problem Relation Age of Onset  . Hyperlipidemia Father   . Diabetes Father   . Hypertension Father   . Osteoporosis Mother   . Breast cancer Sister   . Thyroid cancer Sister   . Heart failure Maternal Uncle   Mom with osteoporosis, no hip fracture.   Review of Systems  Constitutional: Negative.   HENT: Negative.   Eyes: Negative.   Respiratory: Negative.   Cardiovascular: Negative.   Gastrointestinal: Negative.   Endocrine: Negative.   Genitourinary: Positive for menstrual problem.       Hot flashes  Musculoskeletal: Negative.   Skin: Negative.   Allergic/Immunologic: Negative.   Neurological: Negative.   Hematological: Negative.   Psychiatric/Behavioral: Negative.     Exam:   BP 120/78 (BP Location: Right Arm, Patient Position: Sitting, Cuff Size: Normal)   Pulse 80   Ht 5' 5.75" (1.67 m)   Wt 145 lb 12.8 oz (66.1 kg)   LMP 11/30/2017 (Exact Date)   BMI 23.71 kg/m   Weight change: @WEIGHTCHANGE @ Height:   Height: 5' 5.75" (167 cm)  Ht Readings from Last 3 Encounters:  03/28/18 5' 5.75" (1.67 m)  03/13/17 5\' 6"  (1.676 m)  03/03/16  5' 5.75" (1.67 m)    General appearance: alert, cooperative and appears stated age Head: Normocephalic, without obvious abnormality, atraumatic Neck: no adenopathy, supple, symmetrical, trachea midline and thyroid normal to inspection and palpation Lungs: clear to auscultation bilaterally Cardiovascular: regular rate and rhythm Breasts: normal appearance, no masses or tenderness, bilateral implants Abdomen: soft, non-tender; non distended,  no masses,  no organomegaly Extremities: extremities normal, atraumatic, no cyanosis or edema Skin: Skin color, texture, turgor normal. No rashes or lesions Lymph nodes: Cervical, supraclavicular, and axillary nodes normal. No abnormal inguinal nodes  palpated Neurologic: Grossly normal   Pelvic: External genitalia:  no lesions              Urethra:  normal appearing urethra with no masses, tenderness or lesions              Bartholins and Skenes: normal                 Vagina: normal appearing vagina with normal color and discharge, no lesions              Cervix: no lesions               Bimanual Exam:  Uterus:  normal size, contour, position, consistency, mobility, non-tender              Adnexa: no mass, fullness, tenderness               Rectovaginal: Confirms               Anus:  normal sphincter tone, no lesions  Chaperone was present for exam.  A:  Well Woman with normal exam  FH of breast cancer in her sister at 31  Vit d def  Vasomotor symptoms, worst at night  Grief, in counseling, doesn't want medication.   P:   Pap with hpv  Mammogram this month, recommend 3D  Discussed breast self exam  Discussed calcium and vit D intake  Discussed behavioral changes, herbal supplements, gabapentin and HRT. She will try herbal supplements  Screening labs, vit d  Check on sisters genetic testing, discussed sending her to see a genetic counselor  Cyclic provera

## 2018-03-28 ENCOUNTER — Other Ambulatory Visit: Payer: Self-pay

## 2018-03-28 ENCOUNTER — Ambulatory Visit (INDEPENDENT_AMBULATORY_CARE_PROVIDER_SITE_OTHER): Payer: BLUE CROSS/BLUE SHIELD | Admitting: Obstetrics and Gynecology

## 2018-03-28 ENCOUNTER — Other Ambulatory Visit (HOSPITAL_COMMUNITY)
Admission: RE | Admit: 2018-03-28 | Discharge: 2018-03-28 | Disposition: A | Discharge status: Home / Self Care | Payer: BLUE CROSS/BLUE SHIELD | Source: Ambulatory Visit | Attending: Obstetrics and Gynecology | Admitting: Obstetrics and Gynecology

## 2018-03-28 ENCOUNTER — Encounter: Payer: Self-pay | Admitting: Obstetrics and Gynecology

## 2018-03-28 VITALS — BP 120/78 | HR 80 | Ht 65.75 in | Wt 145.8 lb

## 2018-03-28 DIAGNOSIS — Z0000 Encounter for general adult medical examination without abnormal findings: Secondary | ICD-10-CM

## 2018-03-28 DIAGNOSIS — Z01419 Encounter for gynecological examination (general) (routine) without abnormal findings: Secondary | ICD-10-CM | POA: Diagnosis not present

## 2018-03-28 DIAGNOSIS — Z124 Encounter for screening for malignant neoplasm of cervix: Secondary | ICD-10-CM

## 2018-03-28 DIAGNOSIS — N951 Menopausal and female climacteric states: Secondary | ICD-10-CM

## 2018-03-28 DIAGNOSIS — E559 Vitamin D deficiency, unspecified: Secondary | ICD-10-CM | POA: Diagnosis not present

## 2018-03-28 MED ORDER — MEDROXYPROGESTERONE ACETATE 5 MG PO TABS: ORAL_TABLET | ORAL | 1 refills | Status: DC

## 2018-03-28 NOTE — Patient Instructions (Addendum)
Try estroven or estroven pm for hot flashes  EXERCISE AND DIET:  We recommended that you start or continue a regular exercise program for good health. Regular exercise means any activity that makes your heart beat faster and makes you sweat.  We recommend exercising at least 30 minutes per day at least 3 days a week, preferably 4 or 5.  We also recommend a diet low in fat and sugar.  Inactivity, poor dietary choices and obesity can cause diabetes, heart attack, stroke, and kidney damage, among others.    ALCOHOL AND SMOKING:  Women should limit their alcohol intake to no more than 7 drinks/beers/glasses of wine (combined, not each!) per week. Moderation of alcohol intake to this level decreases your risk of breast cancer and liver damage. And of course, no recreational drugs are part of a healthy lifestyle.  And absolutely no smoking or even second hand smoke. Most people know smoking can cause heart and lung diseases, but did you know it also contributes to weakening of your bones? Aging of your skin?  Yellowing of your teeth and nails?  CALCIUM AND VITAMIN D:  Adequate intake of calcium and Vitamin D are recommended.  The recommendations for exact amounts of these supplements seem to change often, but generally speaking 1,200 mg of calcium (between diet and supplement) and 800 units of Vitamin D per day seems prudent. Certain women may benefit from higher intake of Vitamin D.  If you are among these women, your doctor will have told you during your visit.    PAP SMEARS:  Pap smears, to check for cervical cancer or precancers,  have traditionally been done yearly, although recent scientific advances have shown that most women can have pap smears less often.  However, every woman still should have a physical exam from her gynecologist every year. It will include a breast check, inspection of the vulva and vagina to check for abnormal growths or skin changes, a visual exam of the cervix, and then an exam to  evaluate the size and shape of the uterus and ovaries.  And after 47 years of age, a rectal exam is indicated to check for rectal cancers. We will also provide age appropriate advice regarding health maintenance, like when you should have certain vaccines, screening for sexually transmitted diseases, bone density testing, colonoscopy, mammograms, etc.   MAMMOGRAMS:  All women over 83 years old should have a yearly mammogram. Many facilities now offer a "3D" mammogram, which may cost around $50 extra out of pocket. If possible,  we recommend you accept the option to have the 3D mammogram performed.  It both reduces the number of women who will be called back for extra views which then turn out to be normal, and it is better than the routine mammogram at detecting truly abnormal areas.    COLONOSCOPY:  Colonoscopy to screen for colon cancer is recommended for all women at age 83.  We know, you hate the idea of the prep.  We agree, BUT, having colon cancer and not knowing it is worse!!  Colon cancer so often starts as a polyp that can be seen and removed at colonscopy, which can quite literally save your life!  And if your first colonoscopy is normal and you have no family history of colon cancer, most women don't have to have it again for 10 years.  Once every ten years, you can do something that may end up saving your life, right?  We will be happy to  help you get it scheduled when you are ready.  Be sure to check your insurance coverage so you understand how much it will cost.  It may be covered as a preventative service at no cost, but you should check your particular policy.    Perimenopause Perimenopause is the time when your body begins to move into the menopause (no menstrual period for 12 straight months). It is a natural process. Perimenopause can begin 2-8 years before the menopause and usually lasts for 1 year after the menopause. During this time, your ovaries may or may not produce an egg. The  ovaries vary in their production of estrogen and progesterone hormones each month. This can cause irregular menstrual periods, difficulty getting pregnant, vaginal bleeding between periods, and uncomfortable symptoms. What are the causes?  Irregular production of the ovarian hormones, estrogen and progesterone, and not ovulating every month. Other causes include:  Tumor of the pituitary gland in the brain.  Medical disease that affects the ovaries.  Radiation treatment.  Chemotherapy.  Unknown causes.  Heavy smoking and excessive alcohol intake can bring on perimenopause sooner.  What are the signs or symptoms?  Hot flashes.  Night sweats.  Irregular menstrual periods.  Decreased sex drive.  Vaginal dryness.  Headaches.  Mood swings.  Depression.  Memory problems.  Irritability.  Tiredness.  Weight gain.  Trouble getting pregnant.  The beginning of losing bone cells (osteoporosis).  The beginning of hardening of the arteries (atherosclerosis). How is this diagnosed? Your health care provider will make a diagnosis by analyzing your age, menstrual history, and symptoms. He or she will do a physical exam and note any changes in your body, especially your female organs. Female hormone tests may or may not be helpful depending on the amount of female hormones you produce and when you produce them. However, other hormone tests may be helpful to rule out other problems. How is this treated? In some cases, no treatment is needed. The decision on whether treatment is necessary during the perimenopause should be made by you and your health care provider based on how the symptoms are affecting you and your lifestyle. Various treatments are available, such as:  Treating individual symptoms with a specific medicine for that symptom.  Herbal medicines that can help specific symptoms.  Counseling.  Group therapy.  Follow these instructions at home:  Keep track of your  menstrual periods (when they occur, how heavy they are, how long between periods, and how long they last) as well as your symptoms and when they started.  Only take over-the-counter or prescription medicines as directed by your health care provider.  Sleep and rest.  Exercise.  Eat a diet that contains calcium (good for your bones) and soy (acts like the estrogen hormone).  Do not smoke.  Avoid alcoholic beverages.  Take vitamin supplements as recommended by your health care provider. Taking vitamin E may help in certain cases.  Take calcium and vitamin D supplements to help prevent bone loss.  Group therapy is sometimes helpful.  Acupuncture may help in some cases. Contact a health care provider if:  You have questions about any symptoms you are having.  You need a referral to a specialist (gynecologist, psychiatrist, or psychologist). Get help right away if:  You have vaginal bleeding.  Your period lasts longer than 8 days.  Your periods are recurring sooner than 21 days.  You have bleeding after intercourse.  You have severe depression.  You have pain when you urinate.  You have severe headaches.  You have vision problems. This information is not intended to replace advice given to you by your health care provider. Make sure you discuss any questions you have with your health care provider. Document Released: 05/19/2004 Document Revised: 09/17/2015 Document Reviewed: 11/08/2012 Elsevier Interactive Patient Education  2017 [employer].

## 2018-03-29 ENCOUNTER — Other Ambulatory Visit: Payer: Self-pay | Admitting: Obstetrics and Gynecology

## 2018-03-29 DIAGNOSIS — Z1231 Encounter for screening mammogram for malignant neoplasm of breast: Secondary | ICD-10-CM

## 2018-03-30 ENCOUNTER — Telehealth: Payer: Self-pay | Admitting: Obstetrics and Gynecology

## 2018-03-30 DIAGNOSIS — Z803 Family history of malignant neoplasm of breast: Secondary | ICD-10-CM

## 2018-03-30 LAB — COMPREHENSIVE METABOLIC PANEL
ALK PHOS: 84 IU/L (ref 39–117)
ALT: 13 IU/L (ref 0–32)
AST: 21 IU/L (ref 0–40)
Albumin/Globulin Ratio: 2.1 (ref 1.2–2.2)
Albumin: 4.7 g/dL (ref 3.5–5.5)
BUN/Creatinine Ratio: 23 (ref 9–23)
BUN: 18 mg/dL (ref 6–24)
Bilirubin Total: 0.3 mg/dL (ref 0.0–1.2)
CALCIUM: 9.4 mg/dL (ref 8.7–10.2)
CO2: 22 mmol/L (ref 20–29)
CREATININE: 0.78 mg/dL (ref 0.57–1.00)
Chloride: 101 mmol/L (ref 96–106)
GFR calc Af Amer: 105 mL/min/{1.73_m2} (ref >59)
GFR, EST NON AFRICAN AMERICAN: 91 mL/min/{1.73_m2} (ref >59)
GLOBULIN, TOTAL: 2.2 g/dL (ref 1.5–4.5)
GLUCOSE: 73 mg/dL (ref 65–99)
Potassium: 3.9 mmol/L (ref 3.5–5.2)
Sodium: 140 mmol/L (ref 134–144)
Total Protein: 6.9 g/dL (ref 6.0–8.5)

## 2018-03-30 LAB — CBC
HEMATOCRIT: 38.8 % (ref 34.0–46.6)
HEMOGLOBIN: 13.3 g/dL (ref 11.1–15.9)
MCH: 31.4 pg (ref 26.6–33.0)
MCHC: 34.3 g/dL (ref 31.5–35.7)
MCV: 92 fL (ref 79–97)
Platelets: 312 10*3/uL (ref 150–450)
RBC: 4.23 x10E6/uL (ref 3.77–5.28)
RDW: 11.8 % — AB (ref 12.3–15.4)
WBC: 6.2 10*3/uL (ref 3.4–10.8)

## 2018-03-30 LAB — LIPID PANEL
CHOL/HDL RATIO: 3 ratio (ref 0.0–4.4)
CHOLESTEROL TOTAL: 216 mg/dL — AB (ref 100–199)
HDL: 72 mg/dL (ref >39)
LDL CALC: 115 mg/dL — AB (ref 0–99)
TRIGLYCERIDES: 143 mg/dL (ref 0–149)
VLDL CHOLESTEROL CAL: 29 mg/dL (ref 5–40)

## 2018-03-30 LAB — CYTOLOGY - PAP
Diagnosis: NEGATIVE
HPV: NOT DETECTED

## 2018-03-30 LAB — VITAMIN D 25 HYDROXY (VIT D DEFICIENCY, FRACTURES): Vit D, 25-Hydroxy: 26.8 ng/mL — ABNORMAL LOW (ref 30.0–100.0)

## 2018-03-30 NOTE — Telephone Encounter (Signed)
Call to patient. Advised of recommendation for referral to genetic counselor. [place] agreeable. [place]. Requests brief My Chart message with information.   Message sent. Encounter closed.

## 2018-03-30 NOTE — Telephone Encounter (Signed)
Patient calling back with information for Dr Talbert Nan. Her sister who had breast cancer did not have genetic testing done.

## 2018-03-30 NOTE — Telephone Encounter (Signed)
Last AEX 03/28/18.   Routing to Dr. Talbert Nan -please advise on referral to genetic counselor.

## 2018-03-30 NOTE — Telephone Encounter (Signed)
Please let the patient know that I think its a good idea for her to speak with a genetic counselor about her risks for breast cancer. A referral has been placed.

## 2018-04-10 ENCOUNTER — Encounter: Payer: Self-pay | Admitting: Genetics

## 2018-04-10 ENCOUNTER — Telehealth: Payer: Self-pay | Admitting: Genetic Counselor

## 2018-04-10 NOTE — Telephone Encounter (Signed)
New referral received from Dr. Talbert Nan for gentic counseling. Pt has been scheduled to see Ferol Luz on 1/13 at 3pm. Pt aware to arrive 15 minutes early. Letter mailed.

## 2018-05-07 ENCOUNTER — Inpatient Hospital Stay: Payer: BLUE CROSS/BLUE SHIELD | Attending: Genetic Counselor | Admitting: Genetics

## 2018-05-07 ENCOUNTER — Inpatient Hospital Stay: Payer: BLUE CROSS/BLUE SHIELD

## 2018-05-07 DIAGNOSIS — Z808 Family history of malignant neoplasm of other organs or systems: Secondary | ICD-10-CM

## 2018-05-07 DIAGNOSIS — Z800 Family history of malignant neoplasm of digestive organs: Secondary | ICD-10-CM

## 2018-05-07 DIAGNOSIS — Z8042 Family history of malignant neoplasm of prostate: Secondary | ICD-10-CM

## 2018-05-07 DIAGNOSIS — Z801 Family history of malignant neoplasm of trachea, bronchus and lung: Secondary | ICD-10-CM | POA: Diagnosis not present

## 2018-05-07 DIAGNOSIS — Z803 Family history of malignant neoplasm of breast: Secondary | ICD-10-CM | POA: Diagnosis not present

## 2018-05-08 ENCOUNTER — Encounter: Payer: Self-pay | Admitting: Genetics

## 2018-05-08 ENCOUNTER — Ambulatory Visit
Admission: RE | Admit: 2018-05-08 | Discharge: 2018-05-08 | Disposition: A | Discharge status: Home / Self Care | Payer: BLUE CROSS/BLUE SHIELD | Source: Ambulatory Visit | Attending: Obstetrics and Gynecology | Admitting: Obstetrics and Gynecology

## 2018-05-08 DIAGNOSIS — Z1231 Encounter for screening mammogram for malignant neoplasm of breast: Secondary | ICD-10-CM | POA: Diagnosis not present

## 2018-05-08 DIAGNOSIS — Z8042 Family history of malignant neoplasm of prostate: Secondary | ICD-10-CM | POA: Insufficient documentation

## 2018-05-08 DIAGNOSIS — Z800 Family history of malignant neoplasm of digestive organs: Secondary | ICD-10-CM | POA: Insufficient documentation

## 2018-05-08 DIAGNOSIS — Z808 Family history of malignant neoplasm of other organs or systems: Secondary | ICD-10-CM | POA: Insufficient documentation

## 2018-05-08 DIAGNOSIS — Z801 Family history of malignant neoplasm of trachea, bronchus and lung: Secondary | ICD-10-CM | POA: Insufficient documentation

## 2018-05-08 NOTE — Progress Notes (Signed)
REFERRING PROVIDER: Salvadore Dom, [address] [place], [place] [address]  PRIMARY PROVIDER:  Lucretia Kern, DO  PRIMARY REASON FOR VISIT:  1. Family history of breast cancer   2. Family history of thyroid cancer   3. Family history of prostate cancer   4. Family history of lung cancer   5. Family history of pancreatic cancer     HISTORY OF PRESENT ILLNESS:   Alyssa Simpson, a 48 y.o. female, was seen for a [place] cancer genetics consultation at the request of Dr. Talbert Nan due to a family history of cancer.  Alyssa Simpson presents to clinic today to discuss the possibility of a hereditary predisposition to cancer, genetic testing, and to further clarify her future cancer risks, as well as potential cancer risks for family members.   Alyssa Simpson is a 48 y.o. female with no personal history of cancer.    HORMONAL RISK FACTORS:  Menarche was at age 32.  First live birth at age 41.  Ovaries intact: yes.  Hysterectomy: no.  Menopausal status: reports just starting to have symptoms a few months ago.  HRT use: 0 years. Colonoscopy: no; not examined. Mammogram within the last year: yes. Number of breast biopsies: 0.  Past Medical History:  Diagnosis Date  . CIN III (cervical intraepithelial neoplasia III)   . Family history of breast cancer   . Family history of lung cancer   . Family history of pancreatic cancer   . Family history of prostate cancer   . Family history of thyroid cancer     Past Surgical History:  Procedure Laterality Date  . AUGMENTATION MAMMAPLASTY    . BREAST SURGERY     breast augmentation 2011  . CERVICAL BIOPSY  W/ LOOP ELECTRODE EXCISION  2012  . COLPOSCOPY  2012   . LEEP     2012 - CIN 2-3    Social History   Socioeconomic History  . Marital status: Married    Spouse name: Not on file  . Number of children: Not on file  . Years of education: Not on file  . Highest education level: Not on file  Occupational History  . Not  on file  Social Needs  . Financial resource strain: Not on file  . Food insecurity:    Worry: Not on file    Inability: Not on file  . Transportation needs:    Medical: Not on file    Non-medical: Not on file  Tobacco Use  . Smoking status: Never Smoker  . Smokeless tobacco: Never Used  Substance and Sexual Activity  . Alcohol use: Yes    Alcohol/week: 2.0 standard drinks    Types: 2 Glasses of wine per week  . Drug use: No  . Sexual activity: Yes    Partners: Male    Birth control/protection: None    Comment: husband had vasectomy   Lifestyle  . Physical activity:    Days per week: Not on file    Minutes per session: Not on file  . Stress: Not on file  Relationships  . Social connections:    Talks on phone: Not on file    Gets together: Not on file    Attends religious service: Not on file    Active member of club or organization: Not on file    Attends meetings of clubs or organizations: Not on file    Relationship status: Not on file  Other Topics Concern  . Not  on file  Social History Narrative   Work or School: Production designer, theatre/television/film      Home Situation: Lives with husband      Spiritual Beliefs: Christian      Lifestyle: exercises at the gym 4 days per week; diet is healthy        FAMILY HISTORY:  We obtained a detailed, 4-generation family history.  Significant diagnoses are listed below: Family History  Problem Relation Age of Onset  . Hyperlipidemia Father   . Diabetes Father   . Hypertension Father   . Osteoporosis Mother   . Breast cancer Sister 35  . Thyroid cancer Sister 70  . Heart failure Maternal Uncle   . Pancreatic cancer Paternal Aunt 65  . Prostate cancer Paternal Uncle 27       aggressive, died from this cancer about 1 year later  . Pulmonary disease Maternal Grandmother   . Lung cancer Maternal Grandfather   . Prostate cancer Paternal Grandfather 97  . Prostate cancer Paternal Uncle 34       aggressive, died from this cancer  . Skin  cancer Cousin     Alyssa Simpson has 2 sons.  1 son died at the age of 59, and her other son is 60. She has 3 sisters and 1 brother.  -1 sister was dx with thyroid cancer at 39 and breast cancer at 38.  She has not had any genetic testing. -2 sisters in their 63's had abnormal PAP smears and had a hysterectomy. 1 sister had genetic testing about a year ago that was nomal -1 brother is in his 61's with no hx of cancer.   Alyssa Simpson's father: 80, no hx of cancer.  Paternal Aunts/Uncles: 2 paternal uncles ha prostate cancer dx in their 30's- they both died about a year after their dx so it was presumably high grade/ metastatic disease.  1 paternal aunt died at 12 due to pancreatic cancer.  3 paternal aunts and 2 paternal uncles with no hx of cancer.  Paternal cousins: some have had skin cancer, no other cancer hx in cousins Paternal grandfather: died at 1 due to prostate cancer dx in his 30's.  Paternal grandmother:died of heart disease/diabetes related disease.   Alyssa Simpson's mother: 80, osteopetrosis, lung disease, she has had a hysterectomy.  Maternal Aunts/Uncles: 3 maternal uncles and 1 maternal aunt with no hx of cancer.   Maternal cousins: no hx of cancer. Maternal grandfather: died with lung cancer.  Maternal grandmother:died of pulmonary disease.  Her brother also died with pulmonary disease.  She does not know the name of the disease- she reports they worked[place]lived [place] and there has been a cluster of pulmonary disease in this area that may be linked to the paper mill.   Patient's maternal ancestors are of [place] descent, and paternal ancestors are of Irish/[place] descent. There is no reported Ashkenazi Jewish ancestry. There is no known consanguinity.  GENETIC COUNSELING ASSESSMENT: Alyssa Simpson is a 48 y.o. female with a family history which is somewhat suggestive of a Hereditary Cancer Predisposition Syndrome. We, therefore, discussed and recommended the  following at today's visit.   DISCUSSION: We reviewed the characteristics, features and inheritance patterns of hereditary cancer syndromes. We also discussed genetic testing, including the appropriate family members to test, the process of testing, insurance coverage and turn-around-time for results. We discussed the implications of a negative, positive and/or variant of uncertain significant result. We recommended Alyssa Simpson pursue genetic  testing for the CancerNext + Thyroid genes + EGFR.   The CancerNext gene panel offered by [facility] includes sequencing and rearrangement analysis for the following 38 genes:   APC, ATM, BARD1, BMPR1A, BRCA1, BRCA2, BRIP1, CDH1, CDK4, CDKN2A, CHEK2, DICER1, EPCAM, GREM1, HOXB13, MLH1, MRE11A, MSH2, MSH6, MUTYH, MRE11A, NBN, NF1, PALB2, PMS2, POLD1, POLE, PTEN, RAD50, RAD51D, SMAD4, SMARCA4, STK11, and TP53, GALN112, AXIN2, MSH3, NHL1.   Added thyroid genes: RET, PRKAR1A, MEN1, SDHB,SDHD.   We discussed that only 5-10% of cancers are associated with a Hereditary cancer predisposition syndrome.  One of the most common hereditary cancer syndromes that increases breast cancer risk is called Hereditary Breast and Ovarian Cancer (HBOC) syndrome.  This syndrome is caused by mutations in the BRCA1 and BRCA2 genes.  This syndrome increases an individual's lifetime risk to develop breast, ovarian, pancreatic, and other types of cancer.  There are also many other cancer predisposition syndromes caused by mutations in several other genes.  We discussed that if she is found to have a mutation in one of these genes, it may impact future medical management recommendations such as increased cancer screenings and consideration of risk reducing surgeries.  A positive result could also have implications for the patient's family members.  A Negative result would mean we were unable to identify a hereditary component to her cancer, but does not rule out the possibility of a  hereditary basis for her cancer.  There could be mutations that are undetectable by current technology, or in genes not yet tested or identified to increase cancer risk.    We discussed the potential to find a Variant of Uncertain Significance or VUS.  These are variants that have not yet been identified as pathogenic or benign, and it is unknown if this variant is associated with increased cancer risk or if this is a normal finding.  Most VUS's are reclassified to benign or likely benign.   It should not be used to make medical management decisions. With time, we suspect the lab will determine the significance of any VUS's identified if any.   Based on Alyssa Simpson's family history of cancer, she meets medical criteria for genetic testing. Despite that she meets criteria, she may still have an out of pocket cost. The laboratory can provide her with an estimate of her OOP cost.  she was given the contact information for the laboratory if she has further questions.   Based on the patient's personal and family history, the statistical model (Tyrer Cusik)   Was used to estimate her risk of developing Breast Cancer. This estimates her lifetime risk of developing breast cancer to be approximately 8.7%. This estimation is performed in the setting negative genetic test results.  A positive result may significantly impact this risk assessment.  The patient's lifetime breast cancer risk is a preliminary estimate based on available information using one of several models endorsed by the [place] (ACS). The ACS recommends consideration of breast MRI screening as an adjunct to mammography for patients at high risk (defined as 20% or greater lifetime risk). A more detailed breast cancer risk assessment can be considered, if clinically indicated.      PLAN: After considering the risks, benefits, and limitations, Alyssa Simpson  provided informed consent to pursue genetic testing and the blood sample was sent  to [facility] for analysis of the CancerNext + Thyroid genes add on +  EGFr . Results should be available within approximately 2-3 weeks' time, at which point  they will be disclosed by telephone to Alyssa Simpson, as will any additional recommendations warranted by these results. Alyssa Simpson will receive a summary of her genetic counseling visit and a copy of her results once available. This information will also be available in Epic. We encouraged Alyssa Simpson to remain in contact with cancer genetics annually so that we can continuously update the family history and inform her of any changes in cancer genetics and testing that may be of benefit for her family. Alyssa Simpson's questions were answered to her satisfaction today. Our contact information was provided should additional questions or concerns arise.  Based on Alyssa Simpson's family history, we recommended her sister with breast cancer also, have genetic counseling and testing. Alyssa Simpson will let us know if we can be of any assistance in coordinating genetic counseling and/or testing for this family member.   Lastly, we encouraged Alyssa Simpson to remain in contact with cancer genetics annually so that we can continuously update the family history and inform her of any changes in cancer genetics and testing that may be of benefit for this family.   Ms.  Simpson's questions were answered to her satisfaction today. Our contact information was provided should additional questions or concerns arise. Thank you for the referral and allowing [place] to share in the care of your patient.   Tana Felts, MS, [facility] Certified Genetic Counselor [email or web address] phone: [phone]  The patient was seen for a total of 35 minutes in face-to-face genetic counseling.   This patient was discussed with Drs. Magrinat, Lindi Adie and/or Burr Medico who agrees with the above.

## 2018-05-30 ENCOUNTER — Telehealth: Payer: Self-pay | Admitting: Genetics

## 2018-05-30 NOTE — Telephone Encounter (Signed)
Revealed negative genetic testing.  This normal result is reassuring and indicates that it is unlikely Alyssa Simpson's cancer is due to a hereditary cause.  It is unlikely that there is an increased risk of another cancer due to a mutation in one of these genes.  However, genetic testing is not perfect, and cannot definitively rule out a hereditary cause.  It will be important for her to keep in contact with genetics to learn if any additional testing may be needed in the future.     We still encouraged Alyssa Simpson to continue following her doctors' recommendations regarding cancer screening and management.  We also recommended her sister with breast cancer and maternal relatives with a cluster of lung disease also consult with their doctors regarding genetic testing/hereditary risk.

## 2018-06-06 ENCOUNTER — Encounter: Payer: Self-pay | Admitting: Genetics

## 2018-06-06 ENCOUNTER — Ambulatory Visit: Payer: Self-pay | Admitting: Genetics

## 2018-06-06 DIAGNOSIS — Z800 Family history of malignant neoplasm of digestive organs: Secondary | ICD-10-CM

## 2018-06-06 DIAGNOSIS — Z803 Family history of malignant neoplasm of breast: Secondary | ICD-10-CM

## 2018-06-06 DIAGNOSIS — Z8042 Family history of malignant neoplasm of prostate: Secondary | ICD-10-CM

## 2018-06-06 DIAGNOSIS — Z1379 Encounter for other screening for genetic and chromosomal anomalies: Secondary | ICD-10-CM

## 2018-06-06 DIAGNOSIS — Z808 Family history of malignant neoplasm of other organs or systems: Secondary | ICD-10-CM

## 2018-06-06 DIAGNOSIS — Z801 Family history of malignant neoplasm of trachea, bronchus and lung: Secondary | ICD-10-CM

## 2018-06-06 NOTE — Progress Notes (Signed)
HPI:  Alyssa Simpson was previously seen in the [facility] on 05/07/2018 due to a family history of cancer and concerns regarding a hereditary predisposition to cancer. Please refer to our prior cancer genetics clinic note for more information regarding Alyssa Simpson's medical, social and family histories, and our assessment and recommendations, at the time. Alyssa Simpson's recent genetic test results were disclosed to her, as well as recommendations warranted by these results. These results and recommendations are discussed in more detail below.  CANCER HISTORY:   No history exists.     FAMILY HISTORY:  We obtained a detailed, 4-generation family history.  Significant diagnoses are listed below: Family History  Problem Relation Age of Onset  . Hyperlipidemia Father   . Diabetes Father   . Hypertension Father   . Osteoporosis Mother   . Breast cancer Sister 36  . Thyroid cancer Sister 65  . Heart failure Maternal Uncle   . Pancreatic cancer Paternal Aunt 28  . Prostate cancer Paternal Uncle 61       aggressive, died from this cancer about 1 year later  . Pulmonary disease Maternal Grandmother   . Lung cancer Maternal Grandfather   . Prostate cancer Paternal Grandfather 38  . Prostate cancer Paternal Uncle 60       aggressive, died from this cancer  . Skin cancer Cousin     Ms. Moshier has 2 sons.  1 son died at the age of 39, and her other son is 77. She has 3 sisters and 1 brother.  -1 sister was dx with thyroid cancer at 62 and breast cancer at 9.  She has not had any genetic testing. -2 sisters in their 58's had abnormal PAP smears and had a hysterectomy. 1 sister had genetic testing about a year ago that was nomal -1 brother is in his 65's with no hx of cancer.   Ms. Vensel's father: 63, no hx of cancer.  Paternal Aunts/Uncles: 2 paternal uncles ha prostate cancer dx in their 30's- they both died about a year after their dx so it was presumably high grade/  metastatic disease.  1 paternal aunt died at 61 due to pancreatic cancer.  3 paternal aunts and 2 paternal uncles with no hx of cancer.  Paternal cousins: some have had skin cancer, no other cancer hx in cousins Paternal grandfather: died at 52 due to prostate cancer dx in his 73's.  Paternal grandmother:died of heart disease/diabetes related disease.   Ms. Hertenstein's mother: 51, osteopetrosis, lung disease, she has had a hysterectomy.  Maternal Aunts/Uncles: 3 maternal uncles and 1 maternal aunt with no hx of cancer.   Maternal cousins: no hx of cancer. Maternal grandfather: died with lung cancer.  Maternal grandmother:died of pulmonary disease.  Her brother also died with pulmonary disease.  She does not know the name of the disease- she reports [place] and there has been a cluster of pulmonary disease in this area that may be linked to the paper [place].   Patient's maternal ancestors are of [place] descent, and paternal ancestors are of Irish/[place] descent. There is no reported Ashkenazi Jewish ancestry. There is no known consanguinity.  GENETIC TEST RESULTS: Genetic testing performed through [facility]  reported out on 05/29/2018 showed no pathogenic mutations.   Genes Analyzed (44 total): APC*, ATM*, AXIN2, BARD1, BMPR1A, BRCA1*, BRCA2*, BRIP1*, CDH1*, CDK4, CDKN2A, CHEK2*, DICER1,GALNT12, HOXB13, MEN1, MLH1*, MRE11A, MSH2*, MSH3, MSH6*, MUTYH*, NBN, NF1*,  NTHL1, PALB2*, PMS2*, POLD1, POLE, PRKAR1A, PTEN*, RAD50, RAD51C*, RAD51D*, RET, SDHB, SDHD, SMAD4, SMARCA4, STK11 and TP53* (sequencing and deletion/duplication); EGFR (sequencing only); EPCAM and GREM1 (deletion/duplication only). DNA and RNA analyses performed for *genes.  The test report will be scanned into EPIC and will be located under the Molecular Pathology section of the Results Review tab. A portion of the result report is included below for reference.     We  discussed with Alyssa Simpson that because current genetic testing is not perfect, it is possible there may be a gene mutation in one of these genes that current testing cannot detect, but that chance is small.  We also discussed, that there could be another gene that has not yet been discovered, or that we have not yet tested, that is responsible for the cancer diagnoses in the family. It is also possible there is a hereditary cause for the cancer in the family that Alyssa Simpson did not inherit and therefore was not identified in her testing.  Therefore, it is important to remain in touch with cancer genetics in the future so that we can continue to offer Alyssa Simpson the most up to date genetic testing.   ADDITIONAL GENETIC TESTING: We discussed with Alyssa Simpson that her genetic testing was fairly extensive.  If there are are genes identified to increase cancer risk that can be analyzed in the future, we would be happy to discuss and coordinate this testing at that time.    CANCER SCREENING RECOMMENDATIONS: Alyssa Simpson's test result is considered negative (normal).  This means that we have not identified a hereditary predisposition to cancer in her at this time.   While reassuring, this does not definitively rule out a hereditary predisposition to cancer. It is still possible that there could be genetic mutations that are undetectable by current technology, or genetic mutations in genes that have not been tested or identified to increase cancer risk.  Therefore, it is recommended she continue to follow the cancer management and screening guidelines provided by her oncology and primary healthcare provider. An individual's cancer risk is not determined by genetic test results alone.  Overall cancer risk assessment includes additional factors such as personal medical history, family history, etc.  These should be used to make a personalized plan for cancer prevention and surveillance.    Based on the patient's  personal and family history, the statistical model (Tyrer Cusik)   Was used to estimate her risk of developing Breast Cancer. This estimates her lifetime risk of developing breast cancer to be approximately 8.7%.  The patient's lifetime breast cancer risk is a preliminary estimate based on available information using one of several models endorsed by the [place] (ACS). There are limitations to this model (particularly the inability to take into account other cancers such as the family history of prostate cancer)The ACS recommends consideration of breast MRI screening as an adjunct to mammography for patients at high risk (defined as 20% or greater lifetime risk). A more detailed breast cancer risk assessment can be considered, if clinically indicated.          RECOMMENDATIONS FOR FAMILY MEMBERS:  Relatives in this family might be at some increased risk of developing cancer, over the general population risk, simply due to the family history of cancer.  We recommended women in this family have a yearly mammogram beginning at age 58, or 53 years younger than the earliest onset of cancer, an annual clinical breast exam, and perform monthly  breast self-exams. Women in this family should also have a gynecological exam as recommended by their primary provider. All family members should have a colonoscopy by age 43 (or as directed by their doctors).  All family members should inform their physicians about the family history of cancer so their doctors can make the most appropriate screening recommendations for them.   It is also possible there is a hereditary cause for the cancer in Alyssa Simpson's family that she did not inherit and therefore was not identified in her.  Therefore, we recommended her sister and other paternal relatives also have genetic counseling and testing. Alyssa Simpson will let us know if we can be of any assistance in coordinating genetic counseling and/or testing for these  family members.   FOLLOW-UP: Lastly, we discussed with Alyssa Simpson that cancer genetics is a rapidly advancing field and it is possible that new genetic tests will be appropriate for her and/or her family members in the future. We encouraged her to remain in contact with cancer genetics on an annual basis so we can update her personal and family histories and let her know of advances in cancer genetics that may benefit this family.   Our contact number was provided. Alyssa Simpson's questions were answered to her satisfaction, and she knows she is welcome to call us at anytime with additional questions or concerns.   Ferol Luz, MS, [facility] Certified Genetic Counselor [email or web address]

## 2018-06-12 DIAGNOSIS — H40013 Open angle with borderline findings, low risk, bilateral: Secondary | ICD-10-CM | POA: Diagnosis not present

## 2019-01-28 ENCOUNTER — Encounter: Payer: Self-pay | Admitting: Obstetrics and Gynecology

## 2019-01-28 ENCOUNTER — Telehealth: Payer: Self-pay | Admitting: Obstetrics and Gynecology

## 2019-01-28 NOTE — Telephone Encounter (Signed)
Returned call to patient. Patient asking if she should be fasting after midnight the night before her appointment. Patient's appointment at 1500 on 04-03-2019. Patient advised ideally not to eat anything 8 hours prior to appointment. Patient verbalized understanding. States she will eat an early breakfast that am and make sure she fasts for 8 hours. Patient also asking if she can have hormones checked that day. States her hot flashes have gotten worse and "I've tried every remedy under the sun." Office visit offered sooner, but patient declined stating she is okay to wait to discuss at aex. Advised patient to return call to office if desires to be seen sooner than December to discuss vasomotor symptoms.   Routing to provider and will close encounter.

## 2019-01-28 NOTE — Telephone Encounter (Signed)
Message left to return call to Triage Nurse at [phone].    

## 2019-01-28 NOTE — Telephone Encounter (Signed)
[facility]  Phone Number: [phone]        Dr Talbert Nan:   Wanted to inquire if I can have a hormone panel (due to excessive hot flashes) and blood work (cholesterol/sugar check) completed on my next appt. - April 03, 2019?   If so- is the protocol to not eat anything after midnight the night before?

## 2019-01-28 NOTE — Telephone Encounter (Signed)
Patient left voicemail over lunch returning call to [place] [place].

## 2019-04-01 ENCOUNTER — Other Ambulatory Visit: Payer: Self-pay

## 2019-04-01 ENCOUNTER — Other Ambulatory Visit: Payer: Self-pay | Admitting: Obstetrics and Gynecology

## 2019-04-01 DIAGNOSIS — Z1231 Encounter for screening mammogram for malignant neoplasm of breast: Secondary | ICD-10-CM

## 2019-04-01 NOTE — Progress Notes (Signed)
48 y.o. G80P2002 Married White or Caucasian Not Hispanic or Latino female here for annual exam.    She has been gaining weight, no change in diet or exercise.   LMP was in 2/20. She c/o hot flashes in the evenings, all through the night and in the morning. She has night sweats. Up 3-4 x a night, not sleeping well. She tried OTC agents didn't help. No vaginal dryness.     She is doing okay. Doesn't feel depressed. Has good support with friends and husband.   No LMP recorded (lmp unknown).          Sexually active: Yes.    The current method of family planning is vasectomy.    Exercising: Yes.    treadmill, walking, hiking Smoker:  no  Health Maintenance: Pap:03/28/2018 WNL NEG HPV, 02-26-15 WNL NEG HR HPV, 10-16-12 WNL NEG HR HPV History of abnormal Pap:Yes-2012 LEEP MMG:05/08/2018 Birads 1 negative Colonoscopy:Never BMD:2014 normal per patient TDaP:02/27/2014 Gardasil:N/A   reports that she has never smoked. She has never used smokeless tobacco. She reports current alcohol use of about 2.0 standard drinks of alcohol per week. She reports that she does not use drugs. 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 One of her sons died in 07-28-16 at 78. Other son is now 31, graduated from [employer] this year. Working in [place].  Past Medical History:  Diagnosis Date  . CIN III (cervical intraepithelial neoplasia III)   . Family history of breast cancer   . Family history of lung cancer   . Family history of pancreatic cancer   . Family history of prostate cancer   . Family history of thyroid cancer     Past Surgical History:  Procedure Laterality Date  . AUGMENTATION MAMMAPLASTY    . BREAST SURGERY     breast augmentation 07-28-2009  . CERVICAL BIOPSY  W/ LOOP ELECTRODE EXCISION  29-Jul-2010  . COLPOSCOPY  2010/07/29   . LEEP     July 29, 2010 - CIN 2-3    No current outpatient medications on file.   No current facility-administered medications for this visit.     Family History   Problem Relation Age of Onset  . Hyperlipidemia Father   . Diabetes Father   . Hypertension Father   . Osteoporosis Mother   . Breast cancer Sister 76  . Thyroid cancer Sister 77  . Heart failure Maternal Uncle   . Pancreatic cancer Paternal Aunt 14  . Prostate cancer Paternal Uncle 44       aggressive, died from this cancer about 1 year later  . Pulmonary disease Maternal Grandmother   . Lung cancer Maternal Grandfather   . Prostate cancer Paternal Grandfather 65  . Prostate cancer Paternal Uncle 3       aggressive, died from this cancer  . Skin cancer Cousin     Review of Systems  Constitutional:       Hot flashes Night sweats  HENT: Negative.   Eyes: Negative.   Respiratory: Negative.   Cardiovascular: Negative.   Gastrointestinal: Negative.   Endocrine: Negative.   Genitourinary: Negative.   Musculoskeletal: Negative.   Skin: Negative.   Allergic/Immunologic: Negative.   Neurological: Negative.   Hematological: Negative.   Psychiatric/Behavioral: Positive for sleep disturbance.    Exam:   BP 110/76 (BP Location: Right Arm, Patient Position: Sitting, Cuff Size: Normal)   Pulse 76   Temp (!) 97.4 F (36.3 C) (Skin)   Ht 5' 5.75" (1.67 m)  Wt 161 lb (73 kg)   LMP  (LMP Unknown) Comment: 05/2018  BMI 26.18 kg/m   Weight change: @WEIGHTCHANGE @ Height:   Height: 5' 5.75" (167 cm)  Ht Readings from Last 3 Encounters:  04/03/19 5' 5.75" (1.67 m)  03/28/18 5' 5.75" (1.67 m)  03/13/17 5\' 6"  (1.676 m)    General appearance: alert, cooperative and appears stated age Head: Normocephalic, without obvious abnormality, atraumatic Neck: no adenopathy, supple, symmetrical, trachea midline and thyroid normal to inspection and palpation Lungs: clear to auscultation bilaterally Cardiovascular: regular rate and rhythm Breasts: normal appearance, no masses or tenderness, bilateral implants Abdomen: soft, non-tender; non distended,  no masses,  no organomegaly Extremities:  extremities normal, atraumatic, no cyanosis or edema Skin: Skin color, texture, turgor normal. No rashes or lesions Lymph nodes: Cervical, supraclavicular, and axillary nodes normal. No abnormal inguinal nodes palpated Neurologic: Grossly normal   Pelvic: External genitalia:  no lesions              Urethra:  normal appearing urethra with no masses, tenderness or lesions              Bartholins and Skenes: normal                 Vagina: normal appearing vagina with normal color and discharge, no lesions              Cervix: no lesions               Bimanual Exam:  Uterus:  normal size, contour, position, consistency, mobility, non-tender              Adnexa: no mass, fullness, tenderness               Rectovaginal: Confirms               Anus:  normal sphincter tone, no lesions  Chaperone was present for exam.  A:  Well Woman with normal exam  FH of breast cancer, patient with negative genetic testing, TC risk of breast cancer is 8%  Vit d def  Perimenopausal, having bothersome vasomotor symptoms.   P:   No pap this year  Discussed treatment options for vasomotor symptoms, including: behavior changes, HRT, SSRI, gabapentin. Reviewed risks and side effects.  She would like to start HRT  Will f/u in one month (virtual visit)  Discussed breast self exam  Discussed calcium and vit D intake  Screening labs, FSH, TSH  Mammogram in 1/21

## 2019-04-03 ENCOUNTER — Ambulatory Visit (INDEPENDENT_AMBULATORY_CARE_PROVIDER_SITE_OTHER): Payer: BC Managed Care – PPO | Admitting: Obstetrics and Gynecology

## 2019-04-03 ENCOUNTER — Other Ambulatory Visit: Payer: Self-pay

## 2019-04-03 ENCOUNTER — Encounter: Payer: Self-pay | Admitting: Obstetrics and Gynecology

## 2019-04-03 VITALS — BP 110/76 | HR 76 | Temp 97.4°F | Ht 65.75 in | Wt 161.0 lb

## 2019-04-03 DIAGNOSIS — E559 Vitamin D deficiency, unspecified: Secondary | ICD-10-CM | POA: Diagnosis not present

## 2019-04-03 DIAGNOSIS — R635 Abnormal weight gain: Secondary | ICD-10-CM

## 2019-04-03 DIAGNOSIS — N912 Amenorrhea, unspecified: Secondary | ICD-10-CM

## 2019-04-03 DIAGNOSIS — N951 Menopausal and female climacteric states: Secondary | ICD-10-CM

## 2019-04-03 DIAGNOSIS — Z803 Family history of malignant neoplasm of breast: Secondary | ICD-10-CM

## 2019-04-03 DIAGNOSIS — Z01419 Encounter for gynecological examination (general) (routine) without abnormal findings: Secondary | ICD-10-CM

## 2019-04-03 DIAGNOSIS — Z0000 Encounter for general adult medical examination without abnormal findings: Secondary | ICD-10-CM

## 2019-04-03 MED ORDER — ESTRADIOL 0.025 MG/24HR TD PTTW: 1.0000 | MEDICATED_PATCH | TRANSDERMAL | 1 refills | Status: DC

## 2019-04-03 MED ORDER — PROGESTERONE MICRONIZED 100 MG PO CAPS: 100.0000 mg | ORAL_CAPSULE | Freq: Every day | ORAL | 1 refills | Status: DC

## 2019-04-03 NOTE — Patient Instructions (Signed)
EXERCISE AND DIET:  We recommended that you start or continue a regular exercise program for good health. Regular exercise means any activity that makes your heart beat faster and makes you sweat.  We recommend exercising at least 30 minutes per day at least 3 days a week, preferably 4 or 5.  We also recommend a diet low in fat and sugar.  Inactivity, poor dietary choices and obesity can cause diabetes, heart attack, stroke, and kidney damage, among others.   ° °ALCOHOL AND SMOKING:  Women should limit their alcohol intake to no more than 7 drinks/beers/glasses of wine (combined, not each!) per week. Moderation of alcohol intake to this level decreases your risk of breast cancer and liver damage. And of course, no recreational drugs are part of a healthy lifestyle.  And absolutely no smoking or even second hand smoke. Most people know smoking can cause heart and lung diseases, but did you know it also contributes to weakening of your bones? Aging of your skin?  Yellowing of your teeth and nails? ° °CALCIUM AND VITAMIN D:  Adequate intake of calcium and Vitamin D are recommended.  The recommendations for exact amounts of these supplements seem to change often, but generally speaking 1,200 mg of calcium (between diet and supplement) and 800 units of Vitamin D per day seems prudent. Certain women may benefit from higher intake of Vitamin D.  If you are among these women, your doctor will have told you during your visit.   ° °PAP SMEARS:  Pap smears, to check for cervical cancer or precancers,  have traditionally been done yearly, although recent scientific advances have shown that most women can have pap smears less often.  However, every woman still should have a physical exam from her gynecologist every year. It will include a breast check, inspection of the vulva and vagina to check for abnormal growths or skin changes, a visual exam of the cervix, and then an exam to evaluate the size and shape of the uterus and  ovaries.  And after 48 years of age, a rectal exam is indicated to check for rectal cancers. We will also provide age appropriate advice regarding health maintenance, like when you should have certain vaccines, screening for sexually transmitted diseases, bone density testing, colonoscopy, mammograms, etc.  ° °MAMMOGRAMS:  All women over 40 years old should have a yearly mammogram. Many facilities now offer a "3D" mammogram, which may cost around $50 extra out of pocket. If possible,  we recommend you accept the option to have the 3D mammogram performed.  It both reduces the number of women who will be called back for extra views which then turn out to be normal, and it is better than the routine mammogram at detecting truly abnormal areas.   ° °COLON CANCER SCREENING: Now recommend starting at age 45. At this time colonoscopy is not covered for routine screening until 50. There are take home tests that can be done between 45-49.  ° °COLONOSCOPY:  Colonoscopy to screen for colon cancer is recommended for all women at age 50.  We know, you hate the idea of the prep.  We agree, BUT, having colon cancer and not knowing it is worse!!  Colon cancer so often starts as a polyp that can be seen and removed at colonscopy, which can quite literally save your life!  And if your first colonoscopy is normal and you have no family history of colon cancer, most women don't have to have it again for   10 years.  Once every ten years, you can do something that may end up saving your life, right?  We will be happy to help you get it scheduled when you are ready.  Be sure to check your insurance coverage so you understand how much it will cost.  It may be covered as a preventative service at no cost, but you should check your particular policy.   ° ° ° °Breast Self-Awareness °Breast self-awareness means being familiar with how your breasts look and feel. It involves checking your breasts regularly and reporting any changes to your  health care provider. °Practicing breast self-awareness is important. A change in your breasts can be a sign of a serious medical problem. Being familiar with how your breasts look and feel allows you to find any problems early, when treatment is more likely to be successful. All women should practice breast self-awareness, including women who have had breast implants. °How to do a breast self-exam °One way to learn what is normal for your breasts and whether your breasts are changing is to do a breast self-exam. To do a breast self-exam: °Look for Changes ° °1. Remove all the clothing above your waist. °2. Stand in front of a mirror in a room with good lighting. °3. Put your hands on your hips. °4. Push your hands firmly downward. °5. Compare your breasts in the mirror. Look for differences between them (asymmetry), such as: °? Differences in shape. °? Differences in size. °? Puckers, dips, and bumps in one breast and not the other. °6. Look at each breast for changes in your skin, such as: °? Redness. °? Scaly areas. °7. Look for changes in your nipples, such as: °? Discharge. °? Bleeding. °? Dimpling. °? Redness. °? A change in position. °Feel for Changes °Carefully feel your breasts for lumps and changes. It is best to do this while lying on your back on the floor and again while sitting or standing in the shower or tub with soapy water on your skin. Feel each breast in the following way: °· Place the arm on the side of the breast you are examining above your head. °· Feel your breast with the other hand. °· Start in the nipple area and make ¾ inch (2 cm) overlapping circles to feel your breast. Use the pads of your three middle fingers to do this. Apply light pressure, then medium pressure, then firm pressure. The light pressure will allow you to feel the tissue closest to the skin. The medium pressure will allow you to feel the tissue that is a little deeper. The firm pressure will allow you to feel the tissue  close to the ribs. °· Continue the overlapping circles, moving downward over the breast until you feel your ribs below your breast. °· Move one finger-width toward the center of the body. Continue to use the ¾ inch (2 cm) overlapping circles to feel your breast as you move slowly up toward your collarbone. °· Continue the up and down exam using all three pressures until you reach your armpit. ° °Write Down What You Find ° °Write down what is normal for each breast and any changes that you find. Keep a written record with breast changes or normal findings for each breast. By writing this information down, you do not need to depend only on memory for size, tenderness, or location. Write down where you are in your menstrual cycle, if you are still menstruating. °If you are having trouble noticing differences   in your breasts, do not get discouraged. With time you will become more familiar with the variations in your breasts and more comfortable with the exam. How often should I examine my breasts? Examine your breasts every month. If you are breastfeeding, the best time to examine your breasts is after a feeding or after using a breast pump. If you menstruate, the best time to examine your breasts is 5-7 days after your period is over. During your period, your breasts are lumpier, and it may be more difficult to notice changes. When should I see my health care provider? See your health care provider if you notice:  A change in shape or size of your breasts or nipples.  A change in the skin of your breast or nipples, such as a reddened or scaly area.  Unusual discharge from your nipples.  A lump or thick area that was not there before.  Pain in your breasts.  Anything that concerns you.   Menopause and Hormone Replacement Therapy Menopause is a normal time of life when menstrual periods stop completely and the ovaries stop producing the female hormones estrogen and progesterone. This lack of hormones  can affect your health and cause undesirable symptoms. Hormone replacement therapy (HRT) can relieve some of those symptoms. What is hormone replacement therapy? HRT is the use of artificial (synthetic) hormones to replace hormones that your body has stopped producing because you have reached menopause. What are my options for HRT?  HRT may consist of the synthetic hormones estrogen and progestin, or it may consist of only estrogen (estrogen-only therapy). You and your health care provider will decide which form of HRT is best for you. If you choose to be on HRT and you have a uterus, estrogen and progestin are usually prescribed. Estrogen-only therapy is used for women who do not have a uterus. Possible options for taking HRT include:  Pills.  Patches.  Gels.  Sprays.  Vaginal cream.  Vaginal rings.  Vaginal inserts. The amount of hormone(s) that you take and how long you take the hormone(s) varies according to your health. It is important to:  Begin HRT with the lowest possible dosage.  Stop HRT as soon as your health care provider tells you to stop.  Work with your health care provider so that you feel informed and comfortable with your decisions. What are the benefits of HRT? HRT can reduce the frequency and severity of menopausal symptoms. Benefits of HRT vary according to the kind of symptoms that you have, how severe they are, and your overall health. HRT may help to improve the following symptoms of menopause:  Hot flashes and night sweats. These are sudden feelings of heat that spread over the face and body. The skin may turn red, like a blush. Night sweats are hot flashes that happen while you are sleeping or trying to sleep.  Bone loss (osteoporosis). The body loses calcium more quickly after menopause, causing the bones to become weaker. This can increase the risk for bone breaks (fractures).  Vaginal dryness. The lining of the vagina can become thin and dry, which can  cause pain during sex or cause infection, burning, or itching.  Urinary tract infections.  Urinary incontinence. This is the inability to control when you pass urine.  Irritability.  Short-term memory problems. What are the risks of HRT? Risks of HRT vary depending on your individual health and medical history. Risks of HRT also depend on whether you receive both estrogen and progestin or  you receive estrogen only. HRT may increase the risk of:  Spotting. This is when a small amount of blood leaks from the vagina unexpectedly.  Endometrial cancer. This cancer is in the lining of the uterus (endometrium).  Breast cancer.  Increased density of breast tissue. This can make it harder to find breast cancer on a breast X-ray (mammogram).  Stroke.  Heart disease.  Blood clots.  Gallbladder disease.  Liver disease. Risks of HRT can increase if you have any of the following conditions:  Endometrial cancer.  Liver disease.  Heart disease.  Breast cancer.  History of blood clots.  History of stroke. Follow these instructions at home:  Take over-the-counter and prescription medicines only as told by your health care provider.  Get mammograms, pelvic exams, and medical checkups as often as told by your health care provider.  Have Pap tests done as often as told by your health care provider. A Pap test is sometimes called a Pap smear. It is a screening test that is used to check for signs of cancer of the cervix and vagina. A Pap test can also identify the presence of infection or precancerous changes. Pap tests may be done: ? Every 3 years, starting at age 65. ? Every 5 years, starting after age 25, in combination with testing for human papillomavirus (HPV). ? More often or less often depending on other medical conditions you have, your age, and other risk factors.  It is up to you to get the results of your Pap test. Ask your health care provider, or the department that is  doing the test, when your results will be ready.  Keep all follow-up visits as told by your health care provider. This is important. Contact a health care provider if you have:  Pain or swelling in your legs.  Shortness of breath.  Chest pain.  Lumps or changes in your breasts or armpits.  Slurred speech.  Pain, burning, or bleeding when you urinate.  Unusual vaginal bleeding.  Dizziness or headaches.  Weakness or numbness in any part of your arms or legs.  Pain in your abdomen. Summary  Menopause is a normal time of life when menstrual periods stop completely and the ovaries stop producing the female hormones estrogen and progesterone.  Hormone replacement therapy (HRT) can relieve some of the symptoms of menopause.  HRT can reduce the frequency and severity of menopausal symptoms.  Risks of HRT vary depending on your individual health and medical history. This information is not intended to replace advice given to you by your health care provider. Make sure you discuss any questions you have with your health care provider. Document Released: 01/08/2003 Document Revised: 12/12/2017 Document Reviewed: 12/12/2017 Elsevier Patient Education  2020 [employer].

## 2019-04-04 LAB — COMPREHENSIVE METABOLIC PANEL
ALT: 16 IU/L (ref 0–32)
AST: 20 IU/L (ref 0–40)
Albumin/Globulin Ratio: 2.3 — ABNORMAL HIGH (ref 1.2–2.2)
Albumin: 4.8 g/dL (ref 3.8–4.8)
Alkaline Phosphatase: 107 IU/L (ref 39–117)
BUN/Creatinine Ratio: 16 (ref 9–23)
BUN: 13 mg/dL (ref 6–24)
Bilirubin Total: 0.6 mg/dL (ref 0.0–1.2)
CO2: 25 mmol/L (ref 20–29)
Calcium: 9.8 mg/dL (ref 8.7–10.2)
Chloride: 98 mmol/L (ref 96–106)
Creatinine, Ser: 0.82 mg/dL (ref 0.57–1.00)
GFR calc Af Amer: 98 mL/min/{1.73_m2} (ref >59)
GFR calc non Af Amer: 85 mL/min/{1.73_m2} (ref >59)
Globulin, Total: 2.1 g/dL (ref 1.5–4.5)
Glucose: 79 mg/dL (ref 65–99)
Potassium: 3.8 mmol/L (ref 3.5–5.2)
Sodium: 140 mmol/L (ref 134–144)
Total Protein: 6.9 g/dL (ref 6.0–8.5)

## 2019-04-04 LAB — CBC
Hematocrit: 39.8 % (ref 34.0–46.6)
Hemoglobin: 13.6 g/dL (ref 11.1–15.9)
MCH: 31.6 pg (ref 26.6–33.0)
MCHC: 34.2 g/dL (ref 31.5–35.7)
MCV: 92 fL (ref 79–97)
Platelets: 287 10*3/uL (ref 150–450)
RBC: 4.31 x10E6/uL (ref 3.77–5.28)
RDW: 12 % (ref 11.7–15.4)
WBC: 7.3 10*3/uL (ref 3.4–10.8)

## 2019-04-04 LAB — LIPID PANEL
Chol/HDL Ratio: 3 ratio (ref 0.0–4.4)
Cholesterol, Total: 258 mg/dL — ABNORMAL HIGH (ref 100–199)
HDL: 87 mg/dL (ref >39)
LDL Chol Calc (NIH): 162 mg/dL — ABNORMAL HIGH (ref 0–99)
Triglycerides: 55 mg/dL (ref 0–149)
VLDL Cholesterol Cal: 9 mg/dL (ref 5–40)

## 2019-04-04 LAB — VITAMIN D 25 HYDROXY (VIT D DEFICIENCY, FRACTURES): Vit D, 25-Hydroxy: 26.7 ng/mL — ABNORMAL LOW (ref 30.0–100.0)

## 2019-04-04 LAB — TSH: TSH: 0.998 u[IU]/mL (ref 0.450–4.500)

## 2019-04-04 LAB — FOLLICLE STIMULATING HORMONE: FSH: 85.7 m[IU]/mL

## 2019-04-25 ENCOUNTER — Telehealth: Payer: Self-pay | Admitting: Obstetrics and Gynecology

## 2019-04-25 ENCOUNTER — Encounter: Payer: Self-pay | Admitting: Obstetrics and Gynecology

## 2019-04-25 NOTE — Telephone Encounter (Signed)
Patient sent the following message through [place]. Routing to triage to assist patient with request.  [facility]rend, [facility]  Phone Number: [phone]  Hi there [name]:   Just wanted to reach to you to let you know the two hormone medications you had prescribd for me at my last physical appt. are working well with the hot flass issues I was having. If you are able to prescribe a continuation of those prescriptions that would be great. I recall we were going to set up a virtual appt. if needed in January 2021- we can still do that if needed. Please let me know our next steps. Happy New Year to you.   Alyssa Simpson

## 2019-04-25 NOTE — Telephone Encounter (Signed)
Spoke to pt. Pt wanted to give Dr Talbert Nan an update on HRT since started on 04/03/19, states she is doing well and would like refill. Pt scheduled HRT f/u virtual visit on 04/29/18 at 1130 to discuss HRT and for more refills. Pt agreeable.   Will route to Dr Talbert Nan for review and will close encounter.

## 2019-04-30 ENCOUNTER — Telehealth (INDEPENDENT_AMBULATORY_CARE_PROVIDER_SITE_OTHER): Payer: Self-pay | Admitting: Obstetrics and Gynecology

## 2019-04-30 ENCOUNTER — Other Ambulatory Visit: Payer: Self-pay

## 2019-04-30 NOTE — Progress Notes (Signed)
Patient canceled appointment 

## 2019-05-01 ENCOUNTER — Telehealth (INDEPENDENT_AMBULATORY_CARE_PROVIDER_SITE_OTHER): Payer: BC Managed Care – PPO | Admitting: Obstetrics and Gynecology

## 2019-05-01 ENCOUNTER — Other Ambulatory Visit: Payer: Self-pay

## 2019-05-01 ENCOUNTER — Encounter: Payer: Self-pay | Admitting: Obstetrics and Gynecology

## 2019-05-01 DIAGNOSIS — N951 Menopausal and female climacteric states: Secondary | ICD-10-CM

## 2019-05-01 DIAGNOSIS — N939 Abnormal uterine and vaginal bleeding, unspecified: Secondary | ICD-10-CM

## 2019-05-01 DIAGNOSIS — Z79890 Hormone replacement therapy (postmenopausal): Secondary | ICD-10-CM | POA: Diagnosis not present

## 2019-05-01 NOTE — Progress Notes (Signed)
Virtual Visit via Video Note  I connected with Alyssa Simpson on 05/01/19 at 11:00 AM EST by a video enabled telemedicine application and verified that I am speaking with the correct person using two identifiers.  Location: Patient: Work Secondary school teacher: Marketing executive at [employer]   I discussed the limitations of evaluation and management by telemedicine and the availability of in person appointments. The patient expressed understanding and agreed to proceed.  GYNECOLOGY  VISIT   HPI: 49 y.o.   Married White or Caucasian Not Hispanic or Latino  female   [phone] with No LMP recorded (lmp unknown).   here for a virtual visit to f/u on HRT. At the time of her annual exam last month she was c/o significant hot flashes and night sweats, she stopped having cycles 10 months prior.  At the time of the annual exam, she was up 3-4 x a night with night sweats. She was started on the 0.025 mg vivelle dot and 100 mg of Prometrium nightly. She is feeling much better. Less and less hot flashes over time, sleeping better. Vasomotor symptoms are less severe.  She is up 1 x at night void, not having night sweats. Minimal hot flashes during the day, very tolerable. She had spotting yesterday, with mild cramps. Prior LMP was in 2/20. No bleeding today.    GYNECOLOGIC HISTORY: No LMP recorded (lmp unknown). Contraception:vasectomy Menopausal hormone therapy: HRT        OB History    Gravida  2   Para  2   Term  2   Preterm      AB      Living  2     SAB      TAB      Ectopic      Multiple      Live Births  2              Patient Active Problem List   Diagnosis Date Noted  . Genetic testing 06/06/2018  . Family history of thyroid cancer   . Family history of prostate cancer   . Family history of lung cancer   . Family history of pancreatic cancer   . Family history of breast cancer 03/13/2017  . Chest pain 07/27/2015  . CIN III (cervical intraepithelial  neoplasia III)     Past Medical History:  Diagnosis Date  . CIN III (cervical intraepithelial neoplasia III)   . Family history of breast cancer   . Family history of lung cancer   . Family history of pancreatic cancer   . Family history of prostate cancer   . Family history of thyroid cancer     Past Surgical History:  Procedure Laterality Date  . AUGMENTATION MAMMAPLASTY    . BREAST SURGERY     breast augmentation 2011  . CERVICAL BIOPSY  W/ LOOP ELECTRODE EXCISION  2012  . COLPOSCOPY  2012   . LEEP     2012 - CIN 2-3    Current Outpatient Medications  Medication Sig Dispense Refill  . estradiol (VIVELLE-DOT) 0.025 MG/24HR Place 1 patch onto the skin 2 (two) times a week. 8 patch 1  . progesterone (PROMETRIUM) 100 MG capsule Take 1 capsule (100 mg total) by mouth daily. 30 capsule 1   No current facility-administered medications for this visit.     ALLERGIES: Poison ivy extract [poison ivy extract]  Family History  Problem Relation Age of Onset  . Hyperlipidemia Father   . Diabetes  Father   . Hypertension Father   . Osteoporosis Mother   . Breast cancer Sister 18  . Thyroid cancer Sister 61  . Heart failure Maternal Uncle   . Pancreatic cancer Paternal Aunt 40  . Prostate cancer Paternal Uncle 27       aggressive, died from this cancer about 1 year later  . Pulmonary disease Maternal Grandmother   . Lung cancer Maternal Grandfather   . Prostate cancer Paternal Grandfather 72  . Prostate cancer Paternal Uncle 34       aggressive, died from this cancer  . Skin cancer Cousin     Social History   Socioeconomic History  . Marital status: Married    Spouse name: Not on file  . Number of children: Not on file  . Years of education: Not on file  . Highest education level: Not on file  Occupational History  . Not on file  Tobacco Use  . Smoking status: Never Smoker  . Smokeless tobacco: Never Used  Substance and Sexual Activity  . Alcohol use: Yes     Alcohol/week: 2.0 standard drinks    Types: 2 Glasses of wine per week  . Drug use: No  . Sexual activity: Yes    Partners: Male    Birth control/protection: None    Comment: husband had vasectomy   Other Topics Concern  . Not on file  Social History Narrative   Work or School: Production designer, theatre/television/film      Home Situation: Lives with husband      Spiritual Beliefs: Christian      Lifestyle: exercises at the gym 4 days per week; diet is healthy      Social Determinants of Health   Financial Resource Strain:   . Difficulty of Paying Living Expenses: Not on file  Food Insecurity:   . Worried About Charity fundraiser in the Last Year: Not on file  . Ran Out of Food in the Last Year: Not on file  Transportation Needs:   . Lack of Transportation (Medical): Not on file  . Lack of Transportation (Non-Medical): Not on file  Physical Activity:   . Days of Exercise per Week: Not on file  . Minutes of Exercise per Session: Not on file  Stress:   . Feeling of Stress : Not on file  Social Connections:   . Frequency of Communication with Friends and Family: Not on file  . Frequency of Social Gatherings with Friends and Family: Not on file  . Attends Religious Services: Not on file  . Active Member of Clubs or Organizations: Not on file  . Attends Archivist Meetings: Not on file  . Marital Status: Not on file  Intimate Partner Violence:   . Fear of Current or Ex-Partner: Not on file  . Emotionally Abused: Not on file  . Physically Abused: Not on file  . Sexually Abused: Not on file    ROS  PHYSICAL EXAMINATION:    LMP  (LMP Unknown) Comment: 05/2018    General appearance: alert, cooperative and appears stated age  Chaperone was present for exam.  ASSESSMENT Perimenopausal On HRT with control of her vasomotor symptoms Vaginal spotting after 11 months of amenorrhea    PLAN Continue HRT at the current dose (she still has some) Will come to the office for a pelvic  ultrasound, possible endometrial biopsy. Will refill her hormones as long as her evaluation is normal.        The  patient was advised to call back or seek an in-person evaluation if the symptoms worsen or if the condition fails to improve as anticipated.    Salvadore Dom, MD

## 2019-05-04 ENCOUNTER — Encounter: Payer: Self-pay | Admitting: Obstetrics and Gynecology

## 2019-05-06 ENCOUNTER — Telehealth: Payer: Self-pay | Admitting: Obstetrics and Gynecology

## 2019-05-06 NOTE — Telephone Encounter (Signed)
Spoke to pt. Pt states having light pink spotting last week on Wed, then again on Sat 05/04/2019 but with intermittent sharp vagina pains. Pt now states has red bleeding with only changing a panty liner every couple of hours that started today. Has scheduled PUS and OV with Dr Talbert Nan on 05/07/2019 at 2:30pm, pt aware. Pt wanting to know since having bleeding now with being on Prometrium and Vivelle Dot patch, if she needs to continue to take them or stop them before appt on 05/07/2019? Will review with Dr Talbert Nan and will return call to pt. Pt agreeable.   Will route to Dr Talbert Nan for recommendations and advice.

## 2019-05-06 NOTE — Telephone Encounter (Signed)
Patient sent the following correspondence through [place].  Hi there Alyssa Simpson: Today is the second day the spotting has returned and i have a slight sharp pain that random will occur in my vagina area. The sharp pain is like something is poking me in that area- it last 2 seconds- then will go away.  If I need to stop taking the hormones I can. Please let me know your thoughts.  Just wanted to share this information and document it with this message to you.  Thanks-  [name]

## 2019-05-06 NOTE — Telephone Encounter (Signed)
Spoke to pt. Pt aware and agreeable to take patch and prometrium until appt for PUS and OV 05/07/2019.   Will close encounter.

## 2019-05-06 NOTE — Telephone Encounter (Signed)
She should keep taking them. We will figure things out tomorrow.

## 2019-05-07 ENCOUNTER — Ambulatory Visit (INDEPENDENT_AMBULATORY_CARE_PROVIDER_SITE_OTHER): Payer: BC Managed Care – PPO

## 2019-05-07 ENCOUNTER — Encounter: Payer: Self-pay | Admitting: Obstetrics and Gynecology

## 2019-05-07 ENCOUNTER — Other Ambulatory Visit: Payer: Self-pay

## 2019-05-07 ENCOUNTER — Ambulatory Visit: Payer: BC Managed Care – PPO | Admitting: Obstetrics and Gynecology

## 2019-05-07 VITALS — BP 110/60 | HR 88 | Temp 98.1°F | Ht 67.0 in | Wt 161.0 lb

## 2019-05-07 DIAGNOSIS — N951 Menopausal and female climacteric states: Secondary | ICD-10-CM | POA: Diagnosis not present

## 2019-05-07 DIAGNOSIS — Z79890 Hormone replacement therapy (postmenopausal): Secondary | ICD-10-CM

## 2019-05-07 DIAGNOSIS — N939 Abnormal uterine and vaginal bleeding, unspecified: Secondary | ICD-10-CM

## 2019-05-07 MED ORDER — PROGESTERONE MICRONIZED 100 MG PO CAPS: 100.0000 mg | ORAL_CAPSULE | Freq: Every day | ORAL | 3 refills | Status: DC

## 2019-05-07 MED ORDER — ESTRADIOL 0.025 MG/24HR TD PTTW: 1.0000 | MEDICATED_PATCH | TRANSDERMAL | 3 refills | Status: DC

## 2019-05-07 NOTE — Progress Notes (Signed)
GYNECOLOGY  VISIT   HPI: 49 y.o.   Married White or Caucasian Not Hispanic or Latino  female   [phone] with    here for evaluation of AUB. The patient's LMP was in 2/20, she was started on HRT on 04/03/19 for significant vasomotor symptoms.  Her vasomotor symptoms are well controlled on the 0.025 mg estradiol patch and prometrium She stared spotting on 04/29/18 and is still spotting, she noticed small clots this am, never saturating a pad. No pain.    GYNECOLOGIC HISTORY: No LMP recorded. Contraception:none Menopausal hormone therapy: estradiol         OB History    Gravida  2   Para  2   Term  2   Preterm      AB      Living  2     SAB      TAB      Ectopic      Multiple      Live Births  2              Patient Active Problem List   Diagnosis Date Noted  . Genetic testing 06/06/2018  . Family history of thyroid cancer   . Family history of prostate cancer   . Family history of lung cancer   . Family history of pancreatic cancer   . Family history of breast cancer 03/13/2017  . Chest pain 07/27/2015  . CIN III (cervical intraepithelial neoplasia III)     Past Medical History:  Diagnosis Date  . CIN III (cervical intraepithelial neoplasia III)   . Family history of breast cancer   . Family history of lung cancer   . Family history of pancreatic cancer   . Family history of prostate cancer   . Family history of thyroid cancer     Past Surgical History:  Procedure Laterality Date  . AUGMENTATION MAMMAPLASTY    . BREAST SURGERY     breast augmentation 2011  . CERVICAL BIOPSY  W/ LOOP ELECTRODE EXCISION  2012  . COLPOSCOPY  2012   . LEEP     2012 - CIN 2-3    Current Outpatient Medications  Medication Sig Dispense Refill  . estradiol (VIVELLE-DOT) 0.025 MG/24HR Place 1 patch onto the skin 2 (two) times a week. 8 patch 1  . progesterone (PROMETRIUM) 100 MG capsule Take 1 capsule (100 mg total) by mouth daily. 30 capsule 1   No current  facility-administered medications for this visit.     ALLERGIES: Poison ivy extract [poison ivy extract]  Family History  Problem Relation Age of Onset  . Hyperlipidemia Father   . Diabetes Father   . Hypertension Father   . Osteoporosis Mother   . Breast cancer Sister 34  . Thyroid cancer Sister 53  . Heart failure Maternal Uncle   . Pancreatic cancer Paternal Aunt 5  . Prostate cancer Paternal Uncle 22       aggressive, died from this cancer about 1 year later  . Pulmonary disease Maternal Grandmother   . Lung cancer Maternal Grandfather   . Prostate cancer Paternal Grandfather 60  . Prostate cancer Paternal Uncle 71       aggressive, died from this cancer  . Skin cancer Cousin     Social History   Socioeconomic History  . Marital status: Married    Spouse name: Not on file  . Number of children: Not on file  . Years of education: Not on file  .  Highest education level: Not on file  Occupational History  . Not on file  Tobacco Use  . Smoking status: Never Smoker  . Smokeless tobacco: Never Used  Substance and Sexual Activity  . Alcohol use: Yes    Alcohol/week: 2.0 standard drinks    Types: 2 Glasses of wine per week  . Drug use: No  . Sexual activity: Yes    Partners: Male    Birth control/protection: None    Comment: husband had vasectomy   Other Topics Concern  . Not on file  Social History Narrative   Work or School: Production designer, theatre/television/film      Home Situation: Lives with husband      Spiritual Beliefs: Christian      Lifestyle: exercises at the gym 4 days per week; diet is healthy      Social Determinants of Health   Financial Resource Strain:   . Difficulty of Paying Living Expenses: Not on file  Food Insecurity:   . Worried About Charity fundraiser in the Last Year: Not on file  . Ran Out of Food in the Last Year: Not on file  Transportation Needs:   . Lack of Transportation (Medical): Not on file  . Lack of Transportation (Non-Medical): Not  on file  Physical Activity:   . Days of Exercise per Week: Not on file  . Minutes of Exercise per Session: Not on file  Stress:   . Feeling of Stress : Not on file  Social Connections:   . Frequency of Communication with Friends and Family: Not on file  . Frequency of Social Gatherings with Friends and Family: Not on file  . Attends Religious Services: Not on file  . Active Member of Clubs or Organizations: Not on file  . Attends Archivist Meetings: Not on file  . Marital Status: Not on file  Intimate Partner Violence:   . Fear of Current or Ex-Partner: Not on file  . Emotionally Abused: Not on file  . Physically Abused: Not on file  . Sexually Abused: Not on file    Review of Systems  Genitourinary:       Vaginal bleeding    PHYSICAL EXAMINATION:    BP 110/60   Pulse 88   Temp 98.1 F (36.7 C)   Ht 5\' 7"  (1.702 m)   Wt 161 lb (73 kg)   SpO2 96%   BMI 25.22 kg/m     General appearance: alert, cooperative and appears stated age  Ultrasound images reviewed with the patient.   ASSESSMENT Perimenopausal female started on continuous HRT in 12/20 for significant vasomotor symptoms. Symptoms well controlled on low dose HRT Spotting after 11 months of amenorrhea. Ultrasound with thin endometrial stripe    PLAN Discussed that irregular bleeding is not uncommon for the first 6 months of HRT. Given her very reassuring ultrasound will not further evaluate unless her bleeding persists to 6 months or becomes heavy. Refill HRT x 1 year F/U for annual exam in 12/21    An After Visit Summary was printed and given to the patient.

## 2019-05-20 ENCOUNTER — Ambulatory Visit
Admission: RE | Admit: 2019-05-20 | Discharge: 2019-05-20 | Disposition: A | Discharge status: Home / Self Care | Payer: BC Managed Care – PPO | Source: Ambulatory Visit | Attending: Obstetrics and Gynecology | Admitting: Obstetrics and Gynecology

## 2019-05-20 ENCOUNTER — Other Ambulatory Visit: Payer: Self-pay

## 2019-05-20 DIAGNOSIS — Z1231 Encounter for screening mammogram for malignant neoplasm of breast: Secondary | ICD-10-CM

## 2019-06-05 ENCOUNTER — Other Ambulatory Visit: Payer: Self-pay | Admitting: Obstetrics and Gynecology

## 2019-06-05 MED ORDER — ESTRADIOL 0.025 MG/24HR TD PTTW: 1.0000 | MEDICATED_PATCH | TRANSDERMAL | 3 refills | Status: DC

## 2019-06-05 MED ORDER — PROGESTERONE MICRONIZED 100 MG PO CAPS: 100.0000 mg | ORAL_CAPSULE | Freq: Every day | ORAL | 3 refills | Status: DC

## 2019-06-05 NOTE — Telephone Encounter (Signed)
[facility] calling requesting prescriptions for estradiol and progesterone 3 month supply be faxed or called in. Phone is [phone] and fax number [phone].

## 2019-06-05 NOTE — Telephone Encounter (Signed)
Medication refill request: Estradiol  Last AEX:  04-03-2019 JJ  Next AEX: 04-06-20 Last MMG (if hormonal medication request): 05-21-19 density B/BIRADS 1 negative  Refill authorized: Today, please advise.   Medication refill request: Progesterone Refill authorized: Today, please advise.   Medications pended for #90, 0RF. Please refill if appropriate.

## 2019-06-05 NOTE — Telephone Encounter (Signed)
Scripts sent

## 2019-09-10 DIAGNOSIS — H40013 Open angle with borderline findings, low risk, bilateral: Secondary | ICD-10-CM | POA: Diagnosis not present

## 2020-03-26 ENCOUNTER — Emergency Department (INDEPENDENT_AMBULATORY_CARE_PROVIDER_SITE_OTHER): Payer: Managed Care, Other (non HMO)

## 2020-03-26 ENCOUNTER — Emergency Department (INDEPENDENT_AMBULATORY_CARE_PROVIDER_SITE_OTHER)
Admission: EM | Admit: 2020-03-26 | Discharge: 2020-03-26 | Disposition: A | Discharge status: Home / Self Care | Payer: Managed Care, Other (non HMO) | Source: Home / Self Care

## 2020-03-26 ENCOUNTER — Telehealth: Payer: Self-pay

## 2020-03-26 ENCOUNTER — Other Ambulatory Visit: Payer: Self-pay

## 2020-03-26 DIAGNOSIS — J069 Acute upper respiratory infection, unspecified: Secondary | ICD-10-CM | POA: Diagnosis not present

## 2020-03-26 DIAGNOSIS — U071 COVID-19: Secondary | ICD-10-CM | POA: Diagnosis not present

## 2020-03-26 DIAGNOSIS — R059 Cough, unspecified: Secondary | ICD-10-CM | POA: Diagnosis not present

## 2020-03-26 DIAGNOSIS — R55 Syncope and collapse: Secondary | ICD-10-CM

## 2020-03-26 DIAGNOSIS — I950 Idiopathic hypotension: Secondary | ICD-10-CM

## 2020-03-26 LAB — COMPLETE METABOLIC PANEL WITH GFR
AG Ratio: 2.2 (calc) (ref 1.0–2.5)
ALT: 10 U/L (ref 6–29)
AST: 17 U/L (ref 10–35)
Albumin: 4.3 g/dL (ref 3.6–5.1)
Alkaline phosphatase (APISO): 77 U/L (ref 31–125)
BUN: 7 mg/dL (ref 7–25)
CO2: 27 mmol/L (ref 20–32)
Calcium: 8.8 mg/dL (ref 8.6–10.2)
Chloride: 104 mmol/L (ref 98–110)
Creat: 1.04 mg/dL (ref 0.50–1.10)
GFR, Est African American: 73 mL/min/{1.73_m2} (ref >=60)
GFR, Est Non African American: 63 mL/min/{1.73_m2} (ref >=60)
Globulin: 2 g/dL (calc) (ref 1.9–3.7)
Glucose, Bld: 110 mg/dL — ABNORMAL HIGH (ref 65–99)
Potassium: 3.6 mmol/L (ref 3.5–5.3)
Sodium: 137 mmol/L (ref 135–146)
Total Bilirubin: 0.4 mg/dL (ref 0.2–1.2)
Total Protein: 6.3 g/dL (ref 6.1–8.1)

## 2020-03-26 LAB — POCT CBC W AUTO DIFF (K'VILLE URGENT CARE)

## 2020-03-26 LAB — POCT URINALYSIS DIP (MANUAL ENTRY)
Bilirubin, UA: NEGATIVE
Glucose, UA: NEGATIVE mg/dL
Ketones, POC UA: NEGATIVE mg/dL
Leukocytes, UA: NEGATIVE
Nitrite, UA: NEGATIVE
Protein Ur, POC: NEGATIVE mg/dL
Spec Grav, UA: 1.01 (ref 1.010–1.025)
Urobilinogen, UA: 0.2 E.U./dL
pH, UA: 6.5 (ref 5.0–8.0)

## 2020-03-26 LAB — POCT FASTING CBG KUC MANUAL ENTRY: POCT Glucose (KUC): 90 mg/dL (ref 70–99)

## 2020-03-26 LAB — POC SARS CORONAVIRUS 2 AG -  ED: SARS Coronavirus 2 Ag: POSITIVE — AB

## 2020-03-26 MED ORDER — AZITHROMYCIN 250 MG PO TABS: 250.0000 mg | ORAL_TABLET | Freq: Every day | ORAL | 0 refills | Status: DC

## 2020-03-26 NOTE — Telephone Encounter (Signed)
Called pt per ERIN to give CMP results, all normal. Pt states no more dizziness since visit. Inquired about prednisone, per Junie Panning, not beneficial at this time. Advised pt if she develops SOB or wheezing, to call back. Pt acknowledged.

## 2020-03-26 NOTE — ED Notes (Signed)
Upon discharge, pt suddenly got dizzy and cold sweats, felt like passing out. Layed pt down, ginger ale offered. Provider notified. Discharge discontinued and more tests ordered per Leeroy Cha, PAC.

## 2020-03-26 NOTE — Discharge Instructions (Addendum)
You have tested positive for COVID, which is a viral illness. Antibiotics do NOT help with viral illnesses. An antibiotic has been sent to your pharmacy to fill only if your symptoms are not improving by early next week. Until then, continue alternating Tylenol and Motrin, stay well hydrated, drinking plenty of fluids to keep urine clear to light yellow. Get at least 8 hours of sleep, preferably more when sick.  Cough from COVID and other viruses can lasts weeks to months.  If you develop chest pain, trouble breathing, or persistent fever, please be re-evaluated in person. If symptoms severe, go to the closest hospital or call 911.   Please inform your family, especially your household and close contacts you have been diagnosed with Covid.  Everyone in your house should isolate at home and act as if they too have Covid, especially if they have symptoms. If you MUST go out, always wear a facemask and keep your distance from others. Isolate for 10 days from symptom onset and 24 hours after fever resolves without use of a fever reducing medication to help prevent worsening community spread of the virus.    Call 911 or go to the closest hospital if you develop worsening trouble breathing, dizziness/passing out, unable to keep down fluids, or other new concerning symptoms develop.

## 2020-03-26 NOTE — ED Triage Notes (Signed)
Pt c/o fever x 2 days. 100.8 fever Monday and Tuesday, started as head cold. Now chest congestion and sore throat. Loss of smell and taste starting this morning. No known covid exposure. Has not had covid vaccinations. Tylenol and motrin prn.

## 2020-03-26 NOTE — ED Notes (Signed)
Stat pick up at 1235pm

## 2020-03-26 NOTE — ED Provider Notes (Signed)
[facility]    CSN: [id number] Arrival date & time: 03/26/20  1024      History   Chief Complaint Chief Complaint  Patient presents with  . Nasal Congestion  . Sore Throat    Loss of taste and smell    HPI Alyssa Simpson is a 49 y.o. female.   HPI Alyssa Simpson is a 49 y.o. female presenting to UC with c/o 2 days of fever, Tmax 100.8*F, associated nasal congestion that has developed into chest congestion with yellow sputum, sore throat and loss of smell and taste this morning.  No known COVID exposure. She is not vaccinated for COVID.  She has taken Tylenol and Motrin as needed, which has helped with the fever.  Her mother, who is a Marine scientist, encouraged her to be evaluated and prescribed azithromycin for the yellow sputum. Pt denies chest pain or SOB. No n/v/d. No hx of asthma or COPD. Pt has not seen a PCP in 4 years as pt states she is healthy.    Past Medical History:  Diagnosis Date  . CIN III (cervical intraepithelial neoplasia III)   . Family history of breast cancer   . Family history of lung cancer   . Family history of pancreatic cancer   . Family history of prostate cancer   . Family history of thyroid cancer     Patient Active Problem List   Diagnosis Date Noted  . Genetic testing 06/06/2018  . Family history of thyroid cancer   . Family history of prostate cancer   . Family history of lung cancer   . Family history of pancreatic cancer   . Family history of breast cancer 03/13/2017  . Chest pain 07/27/2015  . CIN III (cervical intraepithelial neoplasia III)     Past Surgical History:  Procedure Laterality Date  . AUGMENTATION MAMMAPLASTY    . BREAST SURGERY     breast augmentation 2011  . CERVICAL BIOPSY  W/ LOOP ELECTRODE EXCISION  2012  . COLPOSCOPY  2012   . LEEP     2012 - CIN 2-3    OB History    Gravida  2   Para  2   Term  2   Preterm      AB      Living  2     SAB      TAB      Ectopic       Multiple      Live Births  2            Home Medications    Prior to Admission medications   Medication Sig Start Date End Date Taking? Authorizing Provider  azithromycin (ZITHROMAX) 250 MG tablet Take 1 tablet (250 mg total) by mouth daily. Take first 2 tablets together, then 1 every day until finished. 03/26/20   Noe Gens, PA-C  estradiol (VIVELLE-DOT) 0.025 MG/24HR Place 1 patch onto the skin 2 (two) times a week. 06/06/19   Salvadore Dom, MD  progesterone (PROMETRIUM) 100 MG capsule Take 1 capsule (100 mg total) by mouth daily. 06/05/19   Salvadore Dom, MD    Family History Family History  Problem Relation Age of Onset  . Hyperlipidemia Father   . Diabetes Father   . Hypertension Father   . Osteoporosis Mother   . Breast cancer Sister 82  . Thyroid cancer Sister 36  . Heart failure Maternal Uncle   . Pancreatic cancer Paternal Aunt 70  .  Prostate cancer Paternal Uncle 2       aggressive, died from this cancer about 1 year later  . Pulmonary disease Maternal Grandmother   . Lung cancer Maternal Grandfather   . Prostate cancer Paternal Grandfather 44  . Prostate cancer Paternal Uncle 25       aggressive, died from this cancer  . Skin cancer Cousin     Social History Social History   Tobacco Use  . Smoking status: Never Smoker  . Smokeless tobacco: Never Used  Vaping Use  . Vaping Use: Never used  Substance Use Topics  . Alcohol use: Yes    Alcohol/week: 2.0 standard drinks    Types: 2 Glasses of wine per week  . Drug use: No     Allergies   Poison ivy extract [poison ivy extract]   Review of Systems Review of Systems  Constitutional: Positive for fatigue and fever. Negative for chills.  HENT: Positive for congestion, postnasal drip and sore throat. Negative for ear pain, trouble swallowing and voice change.   Respiratory: Positive for cough. Negative for shortness of breath.   Cardiovascular: Negative for chest pain and  palpitations.  Gastrointestinal: Negative for abdominal pain, diarrhea, nausea and vomiting.  Musculoskeletal: Positive for arthralgias, back pain and myalgias.       Mild body aches  Skin: Negative for rash.  Neurological: Positive for headaches. Negative for dizziness and light-headedness.  All other systems reviewed and are negative.    Physical Exam Triage Vital Signs ED Triage Vitals  Enc Vitals Group     BP 03/26/20 1037 110/76     Pulse Rate 03/26/20 1037 86     Resp 03/26/20 1037 17     Temp 03/26/20 1037 98.9 F (37.2 C)     Temp Source 03/26/20 1037 Oral     SpO2 03/26/20 1037 98 %     Weight --      Height --      Head Circumference --      Peak Flow --      Pain Score 03/26/20 1039 0     Pain Loc --      Pain Edu? --      Excl. in [place]? --    Orthostatic VS for the past 24 hrs:  BP- Lying Pulse- Lying BP- Sitting Pulse- Sitting BP- Standing at 0 minutes Pulse- Standing at 0 minutes  03/26/20 1228 94/58 76 105/72 81 110/75 86    Updated Vital Signs BP 105/72 (BP Location: Right Arm)   Pulse 86   Temp 98.9 F (37.2 C) (Oral)   Resp 17   LMP 05/01/2019 (Exact Date)   SpO2 98%   Visual Acuity Right Eye Distance:   Left Eye Distance:   Bilateral Distance:    Right Eye Near:   Left Eye Near:    Bilateral Near:     Physical Exam Vitals and nursing note reviewed.  Constitutional:      General: She is not in acute distress.    Appearance: She is well-developed. She is not ill-appearing, toxic-appearing or diaphoretic.  HENT:     Head: Normocephalic and atraumatic.     Right Ear: Tympanic membrane and ear canal normal.     Left Ear: Tympanic membrane and ear canal normal.     Nose: Nose normal.     Right Sinus: No maxillary sinus tenderness or frontal sinus tenderness.     Left Sinus: No maxillary sinus tenderness or frontal sinus tenderness.  Mouth/Throat:     Lips: Pink.     Mouth: Mucous membranes are moist.     Pharynx: Oropharynx is clear.  Uvula midline. No pharyngeal swelling, oropharyngeal exudate, posterior oropharyngeal erythema or uvula swelling.  Cardiovascular:     Rate and Rhythm: Normal rate and regular rhythm.  Pulmonary:     Effort: Pulmonary effort is normal. No respiratory distress.     Breath sounds: Normal breath sounds. No stridor. No wheezing, rhonchi or rales.  Musculoskeletal:        General: Normal range of motion.     Cervical back: Normal range of motion and neck supple.  Lymphadenopathy:     Cervical: No cervical adenopathy.  Skin:    General: Skin is warm and dry.  Neurological:     Mental Status: She is alert and oriented to person, place, and time.  Psychiatric:        Behavior: Behavior normal.      UC Treatments / Results  Labs (all labs ordered are listed, but only abnormal results are displayed) Labs Reviewed  POC SARS CORONAVIRUS 2 AG -  ED - Abnormal; Notable for the following components:      Result Value   SARS Coronavirus 2 Ag Positive (*)    All other components within normal limits  POCT URINALYSIS DIP (MANUAL ENTRY) - Abnormal; Notable for the following components:   Blood, UA trace-intact (*)    All other components within normal limits  COMPLETE METABOLIC PANEL WITH GFR  POCT CBC W AUTO DIFF ([facility])  POCT FASTING CBG [facility] MANUAL ENTRY    EKG   Radiology DG Chest 2 View  Result Date: 03/26/2020 CLINICAL DATA:  49 year old female with a history of cough and congestion. EXAM: CHEST - 2 VIEW COMPARISON:  07/01/2015 FINDINGS: Cardiomediastinal silhouette unchanged in size and contour. No pneumothorax or pleural effusion. No confluent airspace disease. No interlobular septal thickening. No acute displaced fracture IMPRESSION: Negative for acute cardiopulmonary disease Electronically Signed   By: Corrie Mckusick D.O.   On: 03/26/2020 12:19    Procedures Procedures (including critical care time)  Medications Ordered in UC Medications - No data to  display  Initial Impression / Assessment and Plan / UC Course  I have reviewed the triage vital signs and the nursing notes.  Pertinent labs & imaging results that were available during my care of the patient were reviewed by me and considered in my medical decision making (see chart for details).     Rapid COVID: POSITIVE No evidence of bacterial infection at this time. Pt insistent on azithromycin, prescription to hold sent to pharmacy with expiration date if fever persists.  Pt is otherwise healthy, does not qualify for MAB tx at this time.  At time of discharge, pt became dizzy and lightheaded after getting off exam table. Additional tests performed.  Reassured pt of normal CXR, CBC, glucose UA is unremarkable.  CMP: Pending BP was 91/60 at that time. States she has not had breakfast this morning, only a cup of coffee. Per medical records, hx of BP in the 90s/60s in the past.  Pt feels better and safe for discharge home after drinking ginger ale  Normal orthostatic vitals. Husband accompanying pt will drive. Encouraged good hydration and rest Discussed symptoms that warrant emergent care in the ED.   Final Clinical Impressions(s) / UC Diagnoses   Final diagnoses:  COVID  Upper respiratory tract infection, unspecified type  Near syncope  Idiopathic hypotension  Discharge Instructions     You have tested positive for COVID, which is a viral illness. Antibiotics do NOT help with viral illnesses. An antibiotic has been sent to your pharmacy to fill only if your symptoms are not improving by early next week. Until then, continue alternating Tylenol and Motrin, stay well hydrated, drinking plenty of fluids to keep urine clear to light yellow. Get at least 8 hours of sleep, preferably more when sick.  Cough from COVID and other viruses can lasts weeks to months.  If you develop chest pain, trouble breathing, or persistent fever, please be re-evaluated in person. If symptoms  severe, go to the closest hospital or call 911.   Please inform your family, especially your household and close contacts you have been diagnosed with Covid.  Everyone in your house should isolate at home and act as if they too have Covid, especially if they have symptoms. If you MUST go out, always wear a facemask and keep your distance from others. Isolate for 10 days from symptom onset and 24 hours after fever resolves without use of a fever reducing medication to help prevent worsening community spread of the virus.    Call 911 or go to the closest hospital if you develop worsening trouble breathing, dizziness/passing out, unable to keep down fluids, or other new concerning symptoms develop.      ED Prescriptions    Medication Sig Dispense Auth. Provider   azithromycin (ZITHROMAX) 250 MG tablet Take 1 tablet (250 mg total) by mouth daily. Take first 2 tablets together, then 1 every day until finished. 6 tablet Noe Gens, PA-C     PDMP not reviewed this encounter.   Noe Gens, [place] 03/26/20 1321

## 2020-03-27 ENCOUNTER — Telehealth: Payer: Self-pay | Admitting: Nurse Practitioner

## 2020-03-27 DIAGNOSIS — U071 COVID-19: Secondary | ICD-10-CM

## 2020-03-27 NOTE — Telephone Encounter (Signed)
Called to discuss with Mardene Speak about Covid symptoms and the use of a monoclonal antibody infusion for those with mild to moderate Covid symptoms and at a high risk of hospitalization.     Pt is qualified for this infusion at the [facility] due to co-morbid conditions and/or a member of an at-risk group, however declines infusion at this time. Symptoms tier reviewed as well as criteria for ending isolation.  Symptoms reviewed that would warrant ED/Hospital evaluation. Preventative practices reviewed. Patient verbalized understanding. Patient advised to call back if he/she opts to proceed with infusion. Callback number provided. Urgent care and/or ER precautions given for severe symptoms. Last date eligible for infusion: 12/12    Patient Active Problem List   Diagnosis Date Noted  . Genetic testing 06/06/2018  . Family history of thyroid cancer   . Family history of prostate cancer   . Family history of lung cancer   . Family history of pancreatic cancer   . Family history of breast cancer 03/13/2017  . Chest pain 07/27/2015  . CIN III (cervical intraepithelial neoplasia III)   SVI-3  Beckey Rutter, NP [phone] [email or web address]

## 2020-04-06 ENCOUNTER — Ambulatory Visit: Payer: 59 | Admitting: Obstetrics and Gynecology

## 2020-04-06 ENCOUNTER — Other Ambulatory Visit: Payer: Self-pay | Admitting: Obstetrics and Gynecology

## 2020-04-06 ENCOUNTER — Other Ambulatory Visit: Payer: Self-pay

## 2020-04-06 ENCOUNTER — Encounter: Payer: Self-pay | Admitting: Obstetrics and Gynecology

## 2020-04-06 VITALS — BP 108/64 | HR 76 | Ht 67.0 in | Wt 158.0 lb

## 2020-04-06 DIAGNOSIS — Z0000 Encounter for general adult medical examination without abnormal findings: Secondary | ICD-10-CM

## 2020-04-06 DIAGNOSIS — E559 Vitamin D deficiency, unspecified: Secondary | ICD-10-CM | POA: Diagnosis not present

## 2020-04-06 DIAGNOSIS — Z01419 Encounter for gynecological examination (general) (routine) without abnormal findings: Secondary | ICD-10-CM

## 2020-04-06 DIAGNOSIS — R7989 Other specified abnormal findings of blood chemistry: Secondary | ICD-10-CM

## 2020-04-06 DIAGNOSIS — Z1231 Encounter for screening mammogram for malignant neoplasm of breast: Secondary | ICD-10-CM

## 2020-04-06 NOTE — Progress Notes (Signed)
49 y.o. G57P2001 Married White or Caucasian Not Hispanic or Latino female here for annual exam.  Patient states that she seems to be breaking out from the patch. She was started on HRT last year. She ran out of her patch 2 weeks ago and she isn't having significant vasomotor symptoms. She got blisters and then scarring from the patch.   No vaginal bleeding, not currently having vasomotor symptoms. No vaginal dryness or dyspareunia.   No bowel or bladder c/o.   They are moving to [place], they have a cabin there and family there. Her job position was limited, she is looking for a job.  Husband is a Theme park manager     She has had negative genetic testing, TC risk for breast cancer is 8%.  She had covid last month  Patient's last menstrual period was 05/01/2019 (exact date).          Sexually active: Yes.    The current method of family planning is post menopausal status.    Exercising: Yes.    walking  Smoker:  no  Health Maintenance: Pap: 03/28/2018 WNL NEG HPV, 02-26-15 WNL NEG HR HPV,10-16-12 WNL NEG HR HPV  History of abnormal Pap:  Yes leep in 08/11/2010 for CIN III MMG:  05/21/19 density B Bi-rads 1 neg  BMD:   08/10/2012 normal per patient  Colonoscopy: never TDaP:  02/27/14  Gardasil: NA    reports that she has never smoked. She has never used smokeless tobacco. She reports current alcohol use of about 2.0 standard drinks of alcohol per week. She reports that she does not use drugs. 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 One of her sons died in 08/10/2016 at 36. Other son is now 27. Working in [place].  Past Medical History:  Diagnosis Date  . CIN III (cervical intraepithelial neoplasia III)   . Family history of breast cancer   . Family history of lung cancer   . Family history of pancreatic cancer   . Family history of prostate cancer   . Family history of thyroid cancer     Past Surgical History:  Procedure Laterality Date  . AUGMENTATION MAMMAPLASTY    . BREAST SURGERY     breast  augmentation 2009-08-10  . CERVICAL BIOPSY  W/ LOOP ELECTRODE EXCISION  08/11/10  . COLPOSCOPY  08/11/10   . LEEP     2010-08-11 - CIN 2-3    Current Outpatient Medications  Medication Sig Dispense Refill  . estradiol (VIVELLE-DOT) 0.025 MG/24HR Place 1 patch onto the skin 2 (two) times a week. 24 patch 3  . progesterone (PROMETRIUM) 100 MG capsule Take 1 capsule (100 mg total) by mouth daily. 90 capsule 3   No current facility-administered medications for this visit.    Family History  Problem Relation Age of Onset  . Hyperlipidemia Father   . Diabetes Father   . Hypertension Father   . Osteoporosis Mother   . Breast cancer Sister 47  . Thyroid cancer Sister 46  . Heart failure Maternal Uncle   . Pancreatic cancer Paternal Aunt 59  . Prostate cancer Paternal Uncle 57       aggressive, died from this cancer about 1 year later  . Pulmonary disease Maternal Grandmother   . Lung cancer Maternal Grandfather   . Prostate cancer Paternal Grandfather 33  . Prostate cancer Paternal Uncle 26       aggressive, died from this cancer  . Skin cancer Cousin  Review of Systems  All other systems reviewed and are negative.   Exam:   BP 108/64   Pulse 76   Ht 5\' 7"  (1.702 m)   Wt 158 lb (71.7 kg)   LMP 05/01/2019 (Exact Date)   SpO2 98%   BMI 24.75 kg/m   Weight change: @WEIGHTCHANGE @ Height:   Height: 5\' 7"  (170.2 cm)  Ht Readings from Last 3 Encounters:  04/06/20 5\' 7"  (1.702 m)  05/07/19 5\' 7"  (1.702 m)  04/03/19 5' 5.75" (1.67 m)    General appearance: alert, cooperative and appears stated age Head: Normocephalic, without obvious abnormality, atraumatic Neck: no adenopathy, supple, symmetrical, trachea midline and thyroid normal to inspection and palpation Lungs: clear to auscultation bilaterally Cardiovascular: regular rate and rhythm Breasts: normal appearance, no masses or tenderness Abdomen: soft, non-tender; non distended,  no masses,  no organomegaly Extremities: extremities  normal, atraumatic, no cyanosis or edema Skin: Skin color, texture, turgor normal. No rashes or lesions Lymph nodes: Cervical, supraclavicular, and axillary nodes normal. No abnormal inguinal nodes palpated Neurologic: Grossly normal   Pelvic: External genitalia:  no lesions              Urethra:  normal appearing urethra with no masses, tenderness or lesions              Bartholins and Skenes: normal                 Vagina: normal appearing vagina with normal color and discharge, no lesions              Cervix: no lesions               Bimanual Exam:  Uterus:  normal size, contour, position, consistency, mobility, non-tender              Adnexa: no mass, fullness, tenderness               Rectovaginal: Confirms               Anus:  normal sphincter tone, no lesions  Gae Dry chaperoned for the exam.  A:  Well Woman with normal exam  HRT, she ran out feels fine. Will not restart unless her vasomotor symptoms resume. Allergy to patch, if she restarts will need oral estrogen or gel with prometrium  Elevated LDL  Low vit d  P:   Needs pap next year  Mammogram due next month  Discussed breast self exam  Discussed calcium and vit D intake  Screening labs, vit D (not fasting)

## 2020-04-06 NOTE — Patient Instructions (Signed)

## 2020-04-08 LAB — COMPREHENSIVE METABOLIC PANEL
ALT: 15 IU/L (ref 0–32)
AST: 16 IU/L (ref 0–40)
Albumin/Globulin Ratio: 2.1 (ref 1.2–2.2)
Albumin: 4.8 g/dL (ref 3.8–4.8)
Alkaline Phosphatase: 91 IU/L (ref 44–121)
BUN/Creatinine Ratio: 14 (ref 9–23)
BUN: 11 mg/dL (ref 6–24)
Bilirubin Total: 0.4 mg/dL (ref 0.0–1.2)
CO2: 24 mmol/L (ref 20–29)
Calcium: 9.8 mg/dL (ref 8.7–10.2)
Chloride: 101 mmol/L (ref 96–106)
Creatinine, Ser: 0.8 mg/dL (ref 0.57–1.00)
GFR calc Af Amer: 100 mL/min/{1.73_m2} (ref >59)
GFR calc non Af Amer: 87 mL/min/{1.73_m2} (ref >59)
Globulin, Total: 2.3 g/dL (ref 1.5–4.5)
Glucose: 87 mg/dL (ref 65–99)
Potassium: 4.1 mmol/L (ref 3.5–5.2)
Sodium: 143 mmol/L (ref 134–144)
Total Protein: 7.1 g/dL (ref 6.0–8.5)

## 2020-04-08 LAB — CBC
Hematocrit: 40.1 % (ref 34.0–46.6)
Hemoglobin: 13.6 g/dL (ref 11.1–15.9)
MCH: 32.2 pg (ref 26.6–33.0)
MCHC: 33.9 g/dL (ref 31.5–35.7)
MCV: 95 fL (ref 79–97)
Platelets: 449 10*3/uL (ref 150–450)
RBC: 4.23 x10E6/uL (ref 3.77–5.28)
RDW: 12 % (ref 11.7–15.4)
WBC: 7 10*3/uL (ref 3.4–10.8)

## 2020-04-08 LAB — LIPID PANEL
Chol/HDL Ratio: 4.5 ratio — ABNORMAL HIGH (ref 0.0–4.4)
Cholesterol, Total: 239 mg/dL — ABNORMAL HIGH (ref 100–199)
HDL: 53 mg/dL (ref >39)
LDL Chol Calc (NIH): 158 mg/dL — ABNORMAL HIGH (ref 0–99)
Triglycerides: 156 mg/dL — ABNORMAL HIGH (ref 0–149)
VLDL Cholesterol Cal: 28 mg/dL (ref 5–40)

## 2020-04-08 LAB — VITAMIN D 25 HYDROXY (VIT D DEFICIENCY, FRACTURES): Vit D, 25-Hydroxy: 27.5 ng/mL — ABNORMAL LOW (ref 30.0–100.0)

## 2020-05-20 ENCOUNTER — Ambulatory Visit: Payer: Managed Care, Other (non HMO)

## 2020-05-29 ENCOUNTER — Ambulatory Visit
Admission: RE | Admit: 2020-05-29 | Discharge: 2020-05-29 | Disposition: A | Discharge status: Home / Self Care | Payer: 59 | Source: Ambulatory Visit | Attending: Obstetrics and Gynecology | Admitting: Obstetrics and Gynecology

## 2020-05-29 ENCOUNTER — Other Ambulatory Visit: Payer: Self-pay

## 2020-05-29 DIAGNOSIS — Z1231 Encounter for screening mammogram for malignant neoplasm of breast: Secondary | ICD-10-CM

## 2020-06-01 ENCOUNTER — Encounter: Payer: Self-pay | Admitting: Obstetrics and Gynecology

## 2020-06-03 ENCOUNTER — Other Ambulatory Visit: Payer: Self-pay | Admitting: Obstetrics and Gynecology

## 2020-06-03 MED ORDER — ESTRADIOL 0.5 MG PO TABS: 0.5000 mg | ORAL_TABLET | Freq: Every day | ORAL | 3 refills | Status: DC

## 2020-06-03 MED ORDER — PROMETRIUM 100 MG PO CAPS: 100.0000 mg | ORAL_CAPSULE | Freq: Every day | ORAL | 3 refills | Status: DC

## 2020-06-24 ENCOUNTER — Other Ambulatory Visit: Payer: Self-pay | Admitting: Obstetrics and Gynecology

## 2020-06-24 MED ORDER — ESTRADIOL 1 MG PO TABS: 1.0000 mg | ORAL_TABLET | Freq: Every day | ORAL | 2 refills | Status: DC

## 2020-09-09 ENCOUNTER — Encounter: Payer: Self-pay | Admitting: Obstetrics and Gynecology

## 2020-09-10 ENCOUNTER — Other Ambulatory Visit: Payer: Self-pay | Admitting: *Deleted

## 2020-09-10 ENCOUNTER — Other Ambulatory Visit: Payer: Self-pay

## 2020-09-10 DIAGNOSIS — N950 Postmenopausal bleeding: Secondary | ICD-10-CM

## 2020-09-30 ENCOUNTER — Ambulatory Visit (INDEPENDENT_AMBULATORY_CARE_PROVIDER_SITE_OTHER): Payer: 59

## 2020-09-30 ENCOUNTER — Encounter: Payer: Self-pay | Admitting: Obstetrics and Gynecology

## 2020-09-30 ENCOUNTER — Ambulatory Visit: Payer: 59 | Admitting: Obstetrics and Gynecology

## 2020-09-30 ENCOUNTER — Other Ambulatory Visit: Payer: Self-pay

## 2020-09-30 ENCOUNTER — Other Ambulatory Visit: Payer: 59 | Admitting: Obstetrics and Gynecology

## 2020-09-30 ENCOUNTER — Other Ambulatory Visit: Payer: 59

## 2020-09-30 VITALS — BP 110/68 | Ht 67.0 in | Wt 161.8 lb

## 2020-09-30 DIAGNOSIS — N950 Postmenopausal bleeding: Secondary | ICD-10-CM | POA: Diagnosis not present

## 2020-09-30 DIAGNOSIS — Z79890 Hormone replacement therapy (postmenopausal): Secondary | ICD-10-CM

## 2020-09-30 NOTE — Progress Notes (Signed)
GYNECOLOGY  VISIT   HPI: 50 y.o.   Married White or Caucasian Not Hispanic or Latino  female   [phone] with Patient's last menstrual period was 05/01/2019 (exact date).   here for an ultrasound for PMP bleeding on HRT patient stopped taking her HRT about one week ago.  She had spotting, then a full blown period.  In March her hot flashes increased (at night), we increased estrogen from 0.5 to 1 mg. In early May she had mild spotting off and on until May 18. She started a cycle on ~5/21 x 5 days. Changing a pad every 3 hours.  She stopped HRT on 09/24/20, feels fine.  Her last period was in early 2021. She went over a year without any bleeding.   Pap from 03/28/18 was negative with negative hpv.  GYNECOLOGIC HISTORY: Patient's last menstrual period was 05/01/2019 (exact date). Contraception:PMP Menopausal hormone therapy: yes        OB History    Gravida  2   Para  2   Term  2   Preterm      AB      Living  2     SAB      IAB      Ectopic      Multiple      Live Births  2              Patient Active Problem List   Diagnosis Date Noted  . Genetic testing 06/06/2018  . Family history of thyroid cancer   . Family history of prostate cancer   . Family history of lung cancer   . Family history of pancreatic cancer   . Family history of breast cancer 03/13/2017  . Chest pain 07/27/2015  . CIN III (cervical intraepithelial neoplasia III)     Past Medical History:  Diagnosis Date  . CIN III (cervical intraepithelial neoplasia III)   . Family history of breast cancer   . Family history of lung cancer   . Family history of pancreatic cancer   . Family history of prostate cancer   . Family history of thyroid cancer     Past Surgical History:  Procedure Laterality Date  . AUGMENTATION MAMMAPLASTY    . BREAST SURGERY     breast augmentation 2011  . CERVICAL BIOPSY  W/ LOOP ELECTRODE EXCISION  2012  . COLPOSCOPY  2012   . LEEP     2012 - CIN 2-3     Current Outpatient Medications  Medication Sig Dispense Refill  . PROMETRIUM 100 MG capsule Take 1 capsule (100 mg total) by mouth daily. 90 capsule 3  . estradiol (ESTRACE) 1 MG tablet Take 1 tablet (1 mg total) by mouth daily. 90 tablet 2   No current facility-administered medications for this visit.     ALLERGIES: Poison ivy extract [poison ivy extract]  Family History  Problem Relation Age of Onset  . Hyperlipidemia Father   . Diabetes Father   . Hypertension Father   . Osteoporosis Mother   . Breast cancer Sister 29  . Thyroid cancer Sister 80  . Heart failure Maternal Uncle   . Pancreatic cancer Paternal Aunt 40  . Prostate cancer Paternal Uncle 26       aggressive, died from this cancer about 1 year later  . Pulmonary disease Maternal Grandmother   . Lung cancer Maternal Grandfather   . Prostate cancer Paternal Grandfather 53  . Prostate cancer Paternal Uncle 34  aggressive, died from this cancer  . Skin cancer Cousin     Social History   Socioeconomic History  . Marital status: Married    Spouse name: Not on file  . Number of children: Not on file  . Years of education: Not on file  . Highest education level: Not on file  Occupational History  . Not on file  Tobacco Use  . Smoking status: Never Smoker  . Smokeless tobacco: Never Used  Vaping Use  . Vaping Use: Never used  Substance and Sexual Activity  . Alcohol use: Yes    Alcohol/week: 2.0 standard drinks    Types: 2 Glasses of wine per week  . Drug use: No  . Sexual activity: Yes    Partners: Male    Birth control/protection: None    Comment: husband had vasectomy   Other Topics Concern  . Not on file  Social History Narrative   Work or School: Production designer, theatre/television/film      Home Situation: Lives with husband      Spiritual Beliefs: Christian      Lifestyle: exercises at the gym 4 days per week; diet is healthy      Social Determinants of Radio broadcast assistant Strain: Not on  file  Food Insecurity: Not on file  Transportation Needs: Not on file  Physical Activity: Not on file  Stress: Not on file  Social Connections: Not on file  Intimate Partner Violence: Not on file    Review of Systems  All other systems reviewed and are negative.   PHYSICAL EXAMINATION:    LMP 05/01/2019 (Exact Date)     General appearance: alert, cooperative and appears stated age  Pelvic: External genitalia:  no lesions              Urethra:  normal appearing urethra with no masses, tenderness or lesions              Bartholins and Skenes: normal                 Vagina: normal appearing vagina with normal color and discharge, no lesions              Cervix: no lesions and not friable              Bimanual Exam:  Uterus:  normal size, contour, position, consistency, mobility, non-tender              Adnexa: no mass, fullness, tenderness               Chaperone was present for exam.  Pelvic ultrasound  Indications: PMP bleeding  Findings:  Uterus 6.8 x 3.89 x 4.03 cm Retroverted  Endometrium 1.88 mm, uniform, thin, no masses  Left ovary 1.45 x 0.93 x 0.87 cm  Right ovary 1.67 x 1.22 x 0.82 c,  No free fluid  Impression:  Normal sized retroverted uterus Thin, symmetrical endometrium without masses Bilateral atrophic appearing ovaries.   1. Postmenopausal bleeding Thin lining on ultrasound, normal exam. Just stopped HRT last week and not having any vasomotor symptoms - Follicle stimulating hormone -If she has continued bleeding, I would recommend endometrial biopsy.  2. Hormone replacement therapy (HRT) Stopped HRT last week. She was on 1 mg of estradiol orally and 100 mg of prometrium daily.  Discussed that she may need to wean off, but will wait and see if her vasomotor symptoms return.  In addition to reviewing the normal ultrasound, lab  work was ordered and HRT was discussed.

## 2020-10-01 LAB — FOLLICLE STIMULATING HORMONE: FSH: 64.7 m[IU]/mL

## 2021-04-06 NOTE — Progress Notes (Addendum)
50 y.o. G2P2002 Married White or Caucasian Not Hispanic or Latino female here for annual exam.   Patient states that she stopped taking HRT on Sep 09, 2020. She notices more hot flashes in the night.   She was seen for PMP bleeding in 6/22, endometrial stripe was 1.88 mm.  She is fasting today for labs.    She has had negative genetic testing, TC risk for breast cancer is 8%.  Patient's last menstrual period was 05/01/2019 (exact date).          Sexually active: Yes.    The current method of family planning is post menopausal status.    Exercising: Yes.     She does walk  Smoker:  no  Health Maintenance: Pap:  03/28/2018 WNL NEG HPV, 02-26-15 WNL NEG HR HPV, 10-16-12 WNL NEG HR HPV   History of abnormal Pap:   Yes leep in 2010-08-02 for CIN III MMG:  05/29/20 density B Bi-rads 1 neg  BMD:   none  Colonoscopy: never  TDaP:  02/27/14  Gardasil: n/a   reports that she has never smoked. She has never used smokeless tobacco. She reports current alcohol use of about 2.0 standard drinks per week. She reports that she does not use drugs. She is drinking 2-3 glasses of wine a day. 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 They are currently living in [place]. 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 Husband is working for a funeral home, Engineer, water (he manages many). One of her sons died in 01-Aug-2016 at 42. Other son is now 10. Working in [place].  Past Medical History:  Diagnosis Date   CIN III (cervical intraepithelial neoplasia III)    Family history of breast cancer    Family history of lung cancer    Family history of pancreatic cancer    Family history of prostate cancer    Family history of thyroid cancer     Past Surgical History:  Procedure Laterality Date   AUGMENTATION MAMMAPLASTY     BREAST SURGERY     breast augmentation 08/01/09   CERVICAL BIOPSY  W/ LOOP ELECTRODE EXCISION  Aug 02, 2010   COLPOSCOPY  August 02, 2010    LEEP     08-02-2010 - CIN 2-3    No current outpatient medications on  file.   No current facility-administered medications for this visit.    Family History  Problem Relation Age of Onset   Hyperlipidemia Father    Diabetes Father    Hypertension Father    Osteoporosis Mother    Breast cancer Sister 69   Thyroid cancer Sister 75   Heart failure Maternal Uncle    Pancreatic cancer Paternal Aunt 4   Prostate cancer Paternal Uncle 32       aggressive, died from this cancer about 1 year later   Pulmonary disease Maternal Grandmother    Lung cancer Maternal Grandfather    Prostate cancer Paternal Grandfather 8   Prostate cancer Paternal Uncle 37       aggressive, died from this cancer   Skin cancer Cousin   Dad had precancerous colon polyps.   Review of Systems  All other systems reviewed and are negative.  Exam:   BP 108/60    Pulse 75    Ht 5\' 7"  (1.702 m)    Wt 158 lb 9.6 oz (71.9 kg)    LMP 05/01/2019 (Exact Date)    SpO2 100%    BMI 24.84 kg/m   Weight  change: @WEIGHTCHANGE @ Height:   Height: 5\' 7"  (170.2 cm)  Ht Readings from Last 3 Encounters:  04/08/21 5\' 7"  (1.702 m)  09/30/20 5\' 7"  (1.702 m)  04/06/20 5\' 7"  (1.702 m)    General appearance: alert, cooperative and appears stated age Head: Normocephalic, without obvious abnormality, atraumatic Neck: no adenopathy, supple, symmetrical, trachea midline and thyroid normal to inspection and palpation Lungs: clear to auscultation bilaterally Cardiovascular: regular rate and rhythm Breasts: normal appearance, no masses or tenderness Abdomen: soft, non-tender; non distended,  no masses,  no organomegaly Extremities: extremities normal, atraumatic, no cyanosis or edema Skin: Skin color, texture, turgor normal. No rashes or lesions Lymph nodes: Cervical, supraclavicular, and axillary nodes normal. No abnormal inguinal nodes palpated Neurologic: Grossly normal   Pelvic: External genitalia:  no lesions              Urethra:  normal appearing urethra with no masses, tenderness or lesions               Bartholins and Skenes: normal                 Vagina: normal appearing vagina with normal color and discharge, no lesions              Cervix: no lesions               Bimanual Exam:  Uterus:  normal size, contour, position, consistency, mobility, non-tender, retroverted              Adnexa: no mass, fullness, tenderness               Rectovaginal: Confirms               Anus:  normal sphincter tone, no lesions  Gae Dry chaperoned for the exam.  1. Well woman exam Discussed breast self exam Discussed calcium and vit D intake Discussed ETOH intake Discussed the option of an SSRI, still dealing with grief, declines for now  2. Laboratory exam ordered as part of routine general medical examination - CBC - Comprehensive metabolic panel  3. Elevated lipids - Lipid panel  4. Vitamin D deficiency She is taking daily vit d and calcium - VITAMIN D 25 Hydroxy (Vit-D Deficiency, Fractures)  Addendum:  5. Screening for cervical cancer - Cytology - PAP with hpv

## 2021-04-08 ENCOUNTER — Ambulatory Visit (INDEPENDENT_AMBULATORY_CARE_PROVIDER_SITE_OTHER): Payer: 59 | Admitting: Obstetrics and Gynecology

## 2021-04-08 ENCOUNTER — Other Ambulatory Visit: Payer: Self-pay

## 2021-04-08 ENCOUNTER — Encounter: Payer: Self-pay | Admitting: Obstetrics and Gynecology

## 2021-04-08 ENCOUNTER — Other Ambulatory Visit (HOSPITAL_COMMUNITY)
Admission: RE | Admit: 2021-04-08 | Discharge: 2021-04-08 | Disposition: A | Discharge status: Home / Self Care | Payer: 59 | Source: Ambulatory Visit | Attending: Obstetrics and Gynecology | Admitting: Obstetrics and Gynecology

## 2021-04-08 VITALS — BP 108/60 | HR 75 | Ht 67.0 in | Wt 158.6 lb

## 2021-04-08 DIAGNOSIS — Z01419 Encounter for gynecological examination (general) (routine) without abnormal findings: Secondary | ICD-10-CM

## 2021-04-08 DIAGNOSIS — Z124 Encounter for screening for malignant neoplasm of cervix: Secondary | ICD-10-CM

## 2021-04-08 DIAGNOSIS — E785 Hyperlipidemia, unspecified: Secondary | ICD-10-CM | POA: Diagnosis not present

## 2021-04-08 DIAGNOSIS — Z0000 Encounter for general adult medical examination without abnormal findings: Secondary | ICD-10-CM

## 2021-04-08 DIAGNOSIS — E559 Vitamin D deficiency, unspecified: Secondary | ICD-10-CM | POA: Diagnosis not present

## 2021-04-08 NOTE — Addendum Note (Signed)
Addended by: Dorothy Spark on: 04/08/2021 04:50 PM   Modules accepted: Orders

## 2021-04-08 NOTE — Patient Instructions (Signed)
You should have a routine screening colonoscopy.  EXERCISE   We recommended that you start or continue a regular exercise program for good health. Physical activity is anything that gets your body moving, some is better than none. The CDC recommends 150 minutes per week of Moderate-Intensity Aerobic Activity and 2 or more days of Muscle Strengthening Activity.  Benefits of exercise are limitless: helps weight loss/weight maintenance, improves mood and energy, helps with depression and anxiety, improves sleep, tones and strengthens muscles, improves balance, improves bone density, protects from chronic conditions such as heart disease, high blood pressure and diabetes and so much more. To learn more visit: WhyNotPoker.uy  DIET: Good nutrition starts with a healthy diet of fruits, vegetables, whole grains, and lean protein sources. Drink plenty of water for hydration. Minimize empty calories, sodium, sweets. For more information about dietary recommendations visit: GeekRegister.com.ee and http://schaefer-mitchell.com/  ALCOHOL:  Women should limit their alcohol intake to no more than 7 drinks/beers/glasses of wine (combined, not each!) per week. Moderation of alcohol intake to this level decreases your risk of breast cancer and liver damage.  If you are concerned that you may have a problem, or your friends have told you they are concerned about your drinking, there are many resources to help. A well-known program that is free, effective, and available to all people all over the nation is Alcoholics Anonymous.  Check out this site to learn more: BlockTaxes.se   CALCIUM AND VITAMIN D:  Adequate intake of calcium and Vitamin D are recommended for bone health.  You should be getting between 1000-1200 mg of calcium and 800 units of Vitamin D daily between diet and supplements  PAP SMEARS:  Pap smears, to check for  cervical cancer or precancers,  have traditionally been done yearly, scientific advances have shown that most women can have pap smears less often.  However, every woman still should have a physical exam from her gynecologist every year. It will include a breast check, inspection of the vulva and vagina to check for abnormal growths or skin changes, a visual exam of the cervix, and then an exam to evaluate the size and shape of the uterus and ovaries. We will also provide age appropriate advice regarding health maintenance, like when you should have certain vaccines, screening for sexually transmitted diseases, bone density testing, colonoscopy, mammograms, etc.   MAMMOGRAMS:  All women over 50 years old should have a routine mammogram.   COLON CANCER SCREENING: Now recommend starting at age 50. At this time colonoscopy is not covered for routine screening until 50. There are take home tests that can be done between 50-50.   COLONOSCOPY:  Colonoscopy to screen for colon cancer is recommended for all women at age 50.  We know, you hate the idea of the prep.  We agree, BUT, having colon cancer and not knowing it is worse!!  Colon cancer so often starts as a polyp that can be seen and removed at colonscopy, which can quite literally save your life!  And if your first colonoscopy is normal and you have no family history of colon cancer, most women don't have to have it again for 10 years.  Once every ten years, you can do something that may end up saving your life, right?  We will be happy to help you get it scheduled when you are ready.  Be sure to check your insurance coverage so you understand how much it will cost.  It may be covered as a preventative  service at no cost, but you should check your particular policy.      Breast Self-Awareness Breast self-awareness means being familiar with how your breasts look and feel. It involves checking your breasts regularly and reporting any changes to your health  care provider. Practicing breast self-awareness is important. A change in your breasts can be a sign of a serious medical problem. Being familiar with how your breasts look and feel allows you to find any problems early, when treatment is more likely to be successful. All women should practice breast self-awareness, including women who have had breast implants. How to do a breast self-exam One way to learn what is normal for your breasts and whether your breasts are changing is to do a breast self-exam. To do a breast self-exam: Look for Changes  Remove all the clothing above your waist. Stand in front of a mirror in a room with good lighting. Put your hands on your hips. Push your hands firmly downward. Compare your breasts in the mirror. Look for differences between them (asymmetry), such as: Differences in shape. Differences in size. Puckers, dips, and bumps in one breast and not the other. Look at each breast for changes in your skin, such as: Redness. Scaly areas. Look for changes in your nipples, such as: Discharge. Bleeding. Dimpling. Redness. A change in position. Feel for Changes Carefully feel your breasts for lumps and changes. It is best to do this while lying on your back on the floor and again while sitting or standing in the shower or tub with soapy water on your skin. Feel each breast in the following way: Place the arm on the side of the breast you are examining above your head. Feel your breast with the other hand. Start in the nipple area and make  inch (2 cm) overlapping circles to feel your breast. Use the pads of your three middle fingers to do this. Apply light pressure, then medium pressure, then firm pressure. The light pressure will allow you to feel the tissue closest to the skin. The medium pressure will allow you to feel the tissue that is a little deeper. The firm pressure will allow you to feel the tissue close to the ribs. Continue the overlapping circles,  moving downward over the breast until you feel your ribs below your breast. Move one finger-width toward the center of the body. Continue to use the  inch (2 cm) overlapping circles to feel your breast as you move slowly up toward your collarbone. Continue the up and down exam using all three pressures until you reach your armpit.  Write Down What You Find  Write down what is normal for each breast and any changes that you find. Keep a written record with breast changes or normal findings for each breast. By writing this information down, you do not need to depend only on memory for size, tenderness, or location. Write down where you are in your menstrual cycle, if you are still menstruating. If you are having trouble noticing differences in your breasts, do not get discouraged. With time you will become more familiar with the variations in your breasts and more comfortable with the exam. How often should I examine my breasts? Examine your breasts every month. If you are breastfeeding, the best time to examine your breasts is after a feeding or after using a breast pump. If you menstruate, the best time to examine your breasts is 5-7 days after your period is over. During your period, your  breasts are lumpier, and it may be more difficult to notice changes. When should I see my health care provider? See your health care provider if you notice: A change in shape or size of your breasts or nipples. A change in the skin of your breast or nipples, such as a reddened or scaly area. Unusual discharge from your nipples. A lump or thick area that was not there before. Pain in your breasts. Anything that concerns you.

## 2021-04-09 ENCOUNTER — Encounter: Payer: Self-pay | Admitting: Obstetrics and Gynecology

## 2021-04-09 LAB — CYTOLOGY - PAP
Adequacy: ABSENT
Comment: NEGATIVE
Diagnosis: NEGATIVE
High risk HPV: POSITIVE — AB

## 2021-04-09 LAB — COMPREHENSIVE METABOLIC PANEL
AG Ratio: 2 (calc) (ref 1.0–2.5)
ALT: 14 U/L (ref 6–29)
AST: 17 U/L (ref 10–35)
Albumin: 4.7 g/dL (ref 3.6–5.1)
Alkaline phosphatase (APISO): 94 U/L (ref 37–153)
BUN: 14 mg/dL (ref 7–25)
CO2: 27 mmol/L (ref 20–32)
Calcium: 9.8 mg/dL (ref 8.6–10.4)
Chloride: 101 mmol/L (ref 98–110)
Creat: 0.83 mg/dL (ref 0.50–1.03)
Globulin: 2.3 g/dL (calc) (ref 1.9–3.7)
Glucose, Bld: 93 mg/dL (ref 65–99)
Potassium: 4.1 mmol/L (ref 3.5–5.3)
Sodium: 137 mmol/L (ref 135–146)
Total Bilirubin: 0.8 mg/dL (ref 0.2–1.2)
Total Protein: 7 g/dL (ref 6.1–8.1)

## 2021-04-09 LAB — LIPID PANEL
Cholesterol: 273 mg/dL — ABNORMAL HIGH (ref <200)
HDL: 76 mg/dL (ref >=50)
LDL Cholesterol (Calc): 172 mg/dL (calc) — ABNORMAL HIGH
Non-HDL Cholesterol (Calc): 197 mg/dL (calc) — ABNORMAL HIGH (ref <130)
Total CHOL/HDL Ratio: 3.6 (calc) (ref <5.0)
Triglycerides: 118 mg/dL (ref <150)

## 2021-04-09 LAB — CBC
HCT: 42.1 % (ref 35.0–45.0)
Hemoglobin: 14.3 g/dL (ref 11.7–15.5)
MCH: 31.6 pg (ref 27.0–33.0)
MCHC: 34 g/dL (ref 32.0–36.0)
MCV: 93.1 fL (ref 80.0–100.0)
MPV: 9.2 fL (ref 7.5–12.5)
Platelets: 308 10*3/uL (ref 140–400)
RBC: 4.52 10*6/uL (ref 3.80–5.10)
RDW: 12.6 % (ref 11.0–15.0)
WBC: 5.9 10*3/uL (ref 3.8–10.8)

## 2021-04-09 LAB — VITAMIN D 25 HYDROXY (VIT D DEFICIENCY, FRACTURES): Vit D, 25-Hydroxy: 36 ng/mL (ref 30–100)

## 2021-04-12 NOTE — Telephone Encounter (Signed)
All questions answered regarding pap smear and regarding LDL cholesterol.

## 2021-04-12 NOTE — Telephone Encounter (Signed)
Called patient and left message for patient to call. I also left message on voicemail that [facility] sent her a message regarding her pap smear she can login and read.

## 2021-04-13 ENCOUNTER — Other Ambulatory Visit: Payer: Self-pay | Admitting: Obstetrics and Gynecology

## 2021-04-13 DIAGNOSIS — Z1231 Encounter for screening mammogram for malignant neoplasm of breast: Secondary | ICD-10-CM

## 2021-05-31 ENCOUNTER — Ambulatory Visit
Admission: RE | Admit: 2021-05-31 | Discharge: 2021-05-31 | Disposition: A | Discharge status: Home / Self Care | Payer: 59 | Source: Ambulatory Visit | Attending: Obstetrics and Gynecology | Admitting: Obstetrics and Gynecology

## 2021-05-31 DIAGNOSIS — Z1231 Encounter for screening mammogram for malignant neoplasm of breast: Secondary | ICD-10-CM

## 2021-06-09 NOTE — Telephone Encounter (Signed)
Form given to [facility] to give to [facility].

## 2022-01-25 ENCOUNTER — Telehealth: Payer: Self-pay | Admitting: Family Medicine

## 2022-01-25 NOTE — Telephone Encounter (Signed)
Patient stating she was told PCP would accept her as a new patient. Pls advise

## 2022-02-08 ENCOUNTER — Encounter: Payer: Self-pay | Admitting: Family Medicine

## 2022-02-08 ENCOUNTER — Ambulatory Visit: Payer: BC Managed Care – PPO | Admitting: Family Medicine

## 2022-02-08 VITALS — BP 106/70 | HR 65 | Temp 97.9°F | Ht 66.14 in | Wt 157.9 lb

## 2022-02-08 DIAGNOSIS — Z1211 Encounter for screening for malignant neoplasm of colon: Secondary | ICD-10-CM

## 2022-02-08 DIAGNOSIS — Z0000 Encounter for general adult medical examination without abnormal findings: Secondary | ICD-10-CM

## 2022-02-08 LAB — CBC WITH DIFFERENTIAL/PLATELET
Basophils Absolute: 0 10*3/uL (ref 0.0–0.1)
Basophils Relative: 0.9 % (ref 0.0–3.0)
Eosinophils Absolute: 0 10*3/uL (ref 0.0–0.7)
Eosinophils Relative: 0.7 % (ref 0.0–5.0)
HCT: 42 % (ref 36.0–46.0)
Hemoglobin: 14.6 g/dL (ref 12.0–15.0)
Lymphocytes Relative: 26.7 % (ref 12.0–46.0)
Lymphs Abs: 1.4 10*3/uL (ref 0.7–4.0)
MCHC: 34.8 g/dL (ref 30.0–36.0)
MCV: 92.5 fl (ref 78.0–100.0)
Monocytes Absolute: 0.4 10*3/uL (ref 0.1–1.0)
Monocytes Relative: 7.1 % (ref 3.0–12.0)
Neutro Abs: 3.5 10*3/uL (ref 1.4–7.7)
Neutrophils Relative %: 64.6 % (ref 43.0–77.0)
Platelets: 298 10*3/uL (ref 150.0–400.0)
RBC: 4.54 Mil/uL (ref 3.87–5.11)
RDW: 12.5 % (ref 11.5–15.5)
WBC: 5.4 10*3/uL (ref 4.0–10.5)

## 2022-02-08 LAB — BASIC METABOLIC PANEL
BUN: 11 mg/dL (ref 6–23)
CO2: 27 mEq/L (ref 19–32)
Calcium: 10.2 mg/dL (ref 8.4–10.5)
Chloride: 102 mEq/L (ref 96–112)
Creatinine, Ser: 0.79 mg/dL (ref 0.40–1.20)
GFR: 86.8 mL/min (ref >60.00)
Glucose, Bld: 80 mg/dL (ref 70–99)
Potassium: 4.3 mEq/L (ref 3.5–5.1)
Sodium: 138 mEq/L (ref 135–145)

## 2022-02-08 LAB — LIPID PANEL
Cholesterol: 203 mg/dL — ABNORMAL HIGH (ref 0–200)
HDL: 63.8 mg/dL (ref >39.00)
LDL Cholesterol: 126 mg/dL — ABNORMAL HIGH (ref 0–99)
NonHDL: 139.31
Total CHOL/HDL Ratio: 3
Triglycerides: 66 mg/dL (ref 0.0–149.0)
VLDL: 13.2 mg/dL (ref 0.0–40.0)

## 2022-02-08 LAB — HEPATIC FUNCTION PANEL
ALT: 14 U/L (ref 0–35)
AST: 18 U/L (ref 0–37)
Albumin: 4.9 g/dL (ref 3.5–5.2)
Alkaline Phosphatase: 78 U/L (ref 39–117)
Bilirubin, Direct: 0.1 mg/dL (ref 0.0–0.3)
Total Bilirubin: 0.6 mg/dL (ref 0.2–1.2)
Total Protein: 7.3 g/dL (ref 6.0–8.3)

## 2022-02-08 LAB — TSH: TSH: 1.02 u[IU]/mL (ref 0.35–5.50)

## 2022-02-08 NOTE — Progress Notes (Addendum)
Established Patient Office Visit  Subjective   Patient ID: Rafeef Lau, female    DOB: 02/27/1971  Age: 51 y.o. MRN: [id number]  Chief Complaint  Patient presents with   New Patient (Initial Visit)    HPI   Dossie is seen to establish care.  I have seen her use husband for years..  She is followed by GYN in [place] currently for Pap smears and mammograms.  She and her husband live up in [facility].  Generally very healthy.  Takes no regular medications.  Health maintenance reviewed  -Pap smear mammogram up-to-date -Tetanus due 2025 -Declines flu vaccine -Declines Shingrix -No history of colon cancer screening.  Social history-married.  This is a second marriage.  Never smoked.  Occasional alcohol use.  She had a 46 year old son that died as a Airline pilot.  She has another son early 29s.  Her husband has 6 children of his own.   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  Mother with history of osteoporosis.  She has 3 sisters and 1 brother.  Brother and sister with history of alcohol abuse.  Question of bipolar in 1 sister  Past Medical History:  Diagnosis Date   CIN III (cervical intraepithelial neoplasia III)    Family history of breast cancer    Family history of lung cancer    Family history of pancreatic cancer    Family history of prostate cancer    Family history of thyroid cancer    Past Surgical History:  Procedure Laterality Date   AUGMENTATION MAMMAPLASTY     BREAST SURGERY     breast augmentation 2011   CERVICAL BIOPSY  W/ LOOP ELECTRODE EXCISION  2012   COLPOSCOPY  2012    LEEP     2012 - CIN 2-3    reports that she has never smoked. She has never used smokeless tobacco. She reports current alcohol use of about 2.0 standard drinks of  alcohol per week. She reports that she does not use drugs. family history includes Alcohol abuse in her brother and sister; Arthritis in her father; Asthma in her sister; Breast cancer (age of onset: 49) in her sister; Cancer in her father and sister; Depression in her brother and sister; Diabetes in her father; Early death in her brother; Hearing loss in her mother; Heart failure in her maternal uncle; Hyperlipidemia in her father; Hypertension in her father; Lung cancer in her maternal grandfather; Mental illness in her brother; Miscarriages / Stillbirths in her mother; Osteoporosis in her mother; Pancreatic cancer (age of onset: 90) in her paternal aunt; Prostate cancer (age of onset: 26) in her paternal uncle and paternal uncle; Prostate cancer (age of onset: 58) in her paternal grandfather; Pulmonary disease in her maternal grandmother; Skin cancer in her cousin; Thyroid cancer (age of onset: 19) in her sister. Allergies  Allergen Reactions   Poison Ivy Extract [Poison Ivy Extract] Rash    Review of Systems  Constitutional:  Negative for chills, fever, malaise/fatigue and weight loss.  HENT:  Negative for hearing loss.   Eyes:  Negative for blurred vision and double vision.  Respiratory:  Negative for cough and shortness of breath.   Cardiovascular:  Negative for chest pain, palpitations and leg swelling.  Gastrointestinal:  Negative for abdominal pain, blood  in stool, constipation and diarrhea.  Genitourinary:  Negative for dysuria.  Skin:  Negative for rash.  Neurological:  Negative for dizziness, speech change, seizures, loss of consciousness and headaches.  Psychiatric/Behavioral:  Negative for depression.       Objective:     BP 106/70 (BP Location: Left Arm, Patient Position: Sitting, Cuff Size: Normal)   Pulse 65   Temp 97.9 F (36.6 C) (Oral)   Ht 5' 6.14" (1.68 m)   Wt 157 lb 14.4 oz (71.6 kg)   LMP 05/01/2019 (Exact Date)   SpO2 99%   BMI 25.38 kg/m    Physical  Exam Vitals reviewed.  Constitutional:      Appearance: Normal appearance. She is well-developed.  HENT:     Head: Normocephalic and atraumatic.  Eyes:     Pupils: Pupils are equal, round, and reactive to light.  Neck:     Thyroid: No thyromegaly.  Cardiovascular:     Rate and Rhythm: Normal rate and regular rhythm.     Heart sounds: Normal heart sounds. No murmur heard. Pulmonary:     Effort: No respiratory distress.     Breath sounds: Normal breath sounds. No wheezing or rales.  Abdominal:     General: Bowel sounds are normal. There is no distension.     Palpations: Abdomen is soft. There is no mass.     Tenderness: There is no abdominal tenderness. There is no guarding or rebound.  Musculoskeletal:        General: Normal range of motion.     Cervical back: Normal range of motion and neck supple.  Lymphadenopathy:     Cervical: No cervical adenopathy.  Skin:    Findings: No rash.  Neurological:     Mental Status: She is alert and oriented to person, place, and time.     Cranial Nerves: No cranial nerve deficit.  Psychiatric:        Behavior: Behavior normal.        Thought Content: Thought content normal.        Judgment: Judgment normal.      No results found for any visits on 02/08/22.    The 10-year ASCVD risk score (Arnett DK, et al., 2019) is: 1%    Assessment & Plan:   Problem List Items Addressed This Visit   None Visit Diagnoses     Physical exam    -  Primary   Relevant Orders   Basic metabolic panel   Lipid panel   CBC with Differential/Platelet   TSH   Hepatic function panel     Healthy 51 year old female.  She sees GYN for Pap smears and mammograms. -We discussed setting up screening colonoscopy and she agrees -Offered flu vaccine and she declines -We discussed Shingrix vaccine.  She is fairly certain she did not have chickenpox as a child -Obtain screening labs as above  No follow-ups on file.    Carolann Littler, MD

## 2022-02-15 ENCOUNTER — Encounter: Payer: Self-pay | Admitting: Gastroenterology

## 2022-02-17 ENCOUNTER — Encounter: Payer: Self-pay | Admitting: Gastroenterology

## 2022-03-14 ENCOUNTER — Ambulatory Visit (AMBULATORY_SURGERY_CENTER): Payer: Self-pay

## 2022-03-14 VITALS — Ht 67.0 in | Wt 150.0 lb

## 2022-03-14 DIAGNOSIS — Z1211 Encounter for screening for malignant neoplasm of colon: Secondary | ICD-10-CM

## 2022-03-14 MED ORDER — NA SULFATE-K SULFATE-MG SULF 17.5-3.13-1.6 GM/177ML PO SOLN: 1.0000 | Freq: Once | ORAL | 0 refills | Status: AC

## 2022-03-14 NOTE — Progress Notes (Signed)
No egg or soy allergy known to patient;  No issues known to pt with past sedation with any surgeries or procedures; Patient denies ever being told they had issues or difficulty with intubation;  No FH of Malignant Hyperthermia; Pt is not on diet pills; Pt is not on home 02;  Pt is not on blood thinners;  Pt denies issues with constipation;  No A fib or A flutter; Have any cardiac testing pending--NO Pt instructed to use Singlecare.com or GoodRx for a price reduction on prep;   Insurance verified during [facility] [place] appt=[employer]  Patient's chart reviewed by Osvaldo Angst CNRA prior to previsit and patient appropriate for the [facility].  Previsit completed and red dot placed by patient's name on their procedure day (on provider's schedule).    GoodRx coupon given to patient during [facility] appt;

## 2022-03-21 ENCOUNTER — Encounter: Payer: Self-pay | Admitting: Certified Registered Nurse Anesthetist

## 2022-03-21 ENCOUNTER — Encounter: Payer: Self-pay | Admitting: Gastroenterology

## 2022-03-28 ENCOUNTER — Encounter: Payer: Self-pay | Admitting: Gastroenterology

## 2022-03-28 ENCOUNTER — Ambulatory Visit (AMBULATORY_SURGERY_CENTER): Payer: BC Managed Care – PPO | Admitting: Gastroenterology

## 2022-03-28 VITALS — BP 100/58 | HR 68 | Temp 97.8°F | Resp 11 | Ht 67.0 in | Wt 150.0 lb

## 2022-03-28 DIAGNOSIS — D123 Benign neoplasm of transverse colon: Secondary | ICD-10-CM | POA: Diagnosis not present

## 2022-03-28 DIAGNOSIS — Z1211 Encounter for screening for malignant neoplasm of colon: Secondary | ICD-10-CM

## 2022-03-28 DIAGNOSIS — D122 Benign neoplasm of ascending colon: Secondary | ICD-10-CM

## 2022-03-28 DIAGNOSIS — Z83719 Family history of colon polyps, unspecified: Secondary | ICD-10-CM | POA: Diagnosis not present

## 2022-03-28 MED ORDER — SODIUM CHLORIDE 0.9 % IV SOLN: 500.0000 mL | Freq: Once | INTRAVENOUS | Status: DC

## 2022-03-28 NOTE — Patient Instructions (Signed)
Handouts/information given for polyps, diverticulosis, hemorrhoids and high fiber diet.  YOU HAD AN ENDOSCOPIC PROCEDURE TODAY AT [facility]:   Refer to the procedure report that was given to you for any specific questions about what was found during the examination.  If the procedure report does not answer your questions, please call your gastroenterologist to clarify.  If you requested that your care partner not be given the details of your procedure findings, then the procedure report has been included in a sealed envelope for you to review at your convenience later.  YOU SHOULD EXPECT: Some feelings of bloating in the abdomen. Passage of more gas than usual.  Walking can help get rid of the air that was put into your GI tract during the procedure and reduce the bloating. If you had a lower endoscopy (such as a colonoscopy or flexible sigmoidoscopy) you may notice spotting of blood in your stool or on the toilet paper. If you underwent a bowel prep for your procedure, you may not have a normal bowel movement for a few days. Please Note:  You might notice some irritation and congestion in your nose or some drainage.  This is from the oxygen used during your procedure.  There is no need for concern and it should clear up in a day or so.  SYMPTOMS TO REPORT IMMEDIATELY:  Following lower endoscopy (colonoscopy):  Excessive amounts of blood in the stool  Significant tenderness or worsening of abdominal pains  Swelling of the abdomen that is new, acute  Fever of 100F or higher  For urgent or emergent issues, a gastroenterologist can be reached at any hour by calling [phone]. Do not use MyChart messaging for urgent concerns.    DIET:  We do recommend a small meal at first, but then you may proceed to your regular diet.  Drink plenty of fluids but you should avoid alcoholic beverages for 24 hours.  ACTIVITY:  You should plan to take it easy for the rest of today and you  should NOT DRIVE or use heavy machinery until tomorrow (because of the sedation medicines used during the test).    FOLLOW UP: Our staff will call the number listed on your records the next business day following your procedure.  We will call around 7:15- 8:00 am to check on you and address any questions or concerns that you may have regarding the information given to you following your procedure. If we do not reach you, we will leave a message.     If any biopsies were taken you will be contacted by phone or by letter within the next 1-3 weeks.  Please call us at [phone] if you have not heard about the biopsies in 3 weeks.    SIGNATURES/CONFIDENTIALITY: You and/or your care partner have signed paperwork which will be entered into your electronic medical record.  These signatures attest to the fact that that the information above on your After Visit Summary has been reviewed and is understood.  Full responsibility of the confidentiality of this discharge information lies with you and/or your care-partner.

## 2022-03-28 NOTE — Progress Notes (Signed)
Called to room to assist during endoscopic procedure.  Patient ID and intended procedure confirmed with present staff. Received instructions for my participation in the procedure from the performing physician.  

## 2022-03-28 NOTE — Op Note (Signed)
[facility] Patient Name: Alyssa Simpson Procedure Date: 03/28/2022 1:24 PM MRN: [id number] Endoscopist: Thornton Park MD, MD, [id number] Age: 51 Referring MD:  Date of Birth: Mar 10, 1971 Gender: Female Account #: [id number] Procedure:                Colonoscopy Indications:              Screening for colorectal malignant neoplasm, This                            is the patient's first colonoscopy                           Father with colon polyps                           Paternal uncle with colon cancer Medicines:                Monitored Anesthesia Care Procedure:                Pre-Anesthesia Assessment:                           - Prior to the procedure, a History and Physical                            was performed, and patient medications and                            allergies were reviewed. The patient's tolerance of                            previous anesthesia was also reviewed. The risks                            and benefits of the procedure and the sedation                            options and risks were discussed with the patient.                            All questions were answered, and informed consent                            was obtained. Prior Anticoagulants: The patient has                            taken no anticoagulant or antiplatelet agents. ASA                            Grade Assessment: II - A patient with mild systemic                            disease. After reviewing the risks and benefits,  the patient was deemed in satisfactory condition to                            undergo the procedure.                           After obtaining informed consent, the colonoscope                            was passed under direct vision. Throughout the                            procedure, the patient's blood pressure, pulse, and                            oxygen saturations were monitored continuously. The                             Olympus CF-HQ190L (Serial# [id number]) Colonoscope was                            introduced through the anus and advanced to the 3                            cm into the ileum. A second forward view of the                            right colon was performed. The colonoscopy was                            performed without difficulty. The patient tolerated                            the procedure well. The quality of the bowel                            preparation was excellent. The terminal ileum,                            ileocecal valve, appendiceal orifice, and rectum                            were photographed. Scope In: 1:26:28 PM Scope Out: 1:44:54 PM Scope Withdrawal Time: 0 hours 15 minutes 20 seconds  Total Procedure Duration: 0 hours 18 minutes 26 seconds  Findings:                 The perianal and digital rectal examinations were                            normal.                           Non-bleeding internal hemorrhoids were found.  Multiple medium-mouthed and small-mouthed                            diverticula were found in the sigmoid colon and                            descending colon.                           A 4 mm polyp was found in the proximal transverse                            colon. The polyp was flat. The polyp was removed                            with a cold snare. Resection and retrieval were                            complete. Estimated blood loss was minimal.                           A 3 mm polyp was found in the ascending colon. The                            polyp was sessile. The polyp was removed with a                            piecemeal technique using a cold snare. Resection                            and retrieval were complete. Estimated blood loss                            was minimal.                           The exam was otherwise without abnormality on                            direct and  retroflexion views. Complications:            No immediate complications. Estimated Blood Loss:     Estimated blood loss was minimal. Impression:               - Non-bleeding internal hemorrhoids.                           - Diverticulosis in the sigmoid colon and in the                            descending colon.                           - One 4 mm polyp in the proximal transverse colon,  removed with a cold snare. Resected and retrieved.                           - One 3 mm polyp in the ascending colon, removed                            piecemeal using a cold snare. Resected and                            retrieved.                           - The examination was otherwise normal on direct                            and retroflexion views. Recommendation:           - Patient has a contact number available for                            emergencies. The signs and symptoms of potential                            delayed complications were discussed with the                            patient. Return to normal activities tomorrow.                            Written discharge instructions were provided to the                            patient.                           - High fiber diet.                           - Continue present medications.                           - Await pathology results.                           - Repeat colonoscopy in 5 years for surveillance                            regardless of pathology results given the family                            history.                           - Follow a high fiber diet. Drink at least 64  ounces of water daily. Add a daily stool bulking                            agent such as psyllium (an exampled would be                            Metamucil).                           - Emerging evidence supports eating a diet of                            fruits, vegetables, grains,  calcium, and yogurt                            while reducing red meat and alcohol may reduce the                            risk of colon cancer.                           - Thank you for allowing me to be involved in your                            colon cancer prevention. Thornton Park MD, MD 03/28/2022 1:51:06 PM This report has been signed electronically.

## 2022-03-28 NOTE — Progress Notes (Signed)
Report given to PACU, vss 

## 2022-03-28 NOTE — Progress Notes (Signed)
Referring Provider: Eulas Post, MD Primary Care Physician:  Eulas Post, MD  Indication for Colonoscopy:  Colon cancer screening   IMPRESSION:  Need for colon cancer screening Appropriate candidate for monitored anesthesia care  PLAN: Colonoscopy in the [place] today   HPI: Alyssa Simpson is a 51 y.o. female presents for screening colonoscopy.  No prior colonoscopy or colon cancer screening.  Father with colon polyps. Paternal uncle with colon cancer. No other known family history of colon cancer or polyps. No family history of uterine/endometrial cancer, pancreatic cancer or gastric/stomach cancer.   Past Medical History:  Diagnosis Date   CIN III (cervical intraepithelial neoplasia III)    Family history of breast cancer    Family history of lung cancer    Family history of pancreatic cancer    Family history of prostate cancer    Family history of thyroid cancer     Past Surgical History:  Procedure Laterality Date   AUGMENTATION MAMMAPLASTY  2011   CERVICAL BIOPSY  W/ LOOP ELECTRODE EXCISION  2012   COLPOSCOPY  2012   LEEP  2012   CIN 2-3   [place]    No current outpatient medications on file.   Current Facility-Administered Medications  Medication Dose Route Frequency Provider Last Rate Last Admin   0.9 %  sodium chloride infusion  500 mL Intravenous Once Thornton Park, MD        Allergies as of 03/28/2022 - Review Complete 03/28/2022  Allergen Reaction Noted   Poison ivy extract [poison ivy extract] Rash 02/27/2014    Family History  Problem Relation Age of Onset   Hearing loss Mother    Osteoporosis Mother    Miscarriages / [place] Mother    Prostate cancer Father 32   Colon polyps Father 77   Arthritis Father    Hyperlipidemia Father    Diabetes Father    Hypertension Father    Breast cancer Sister 56   Thyroid cancer Sister 44   Depression Sister    Alcohol abuse Sister    Asthma Sister     Depression Brother    Alcohol abuse Brother    Mental illness Brother    Heart failure Maternal Uncle    Pancreatic cancer Paternal Aunt 75   Colon cancer Paternal Uncle    Prostate cancer Paternal Uncle 52       aggressive, died from this cancer about 1 year later   Prostate cancer Paternal Uncle 39       aggressive, died from this cancer   Pulmonary disease Maternal Grandmother    Lung cancer Maternal Grandfather    Prostate cancer Paternal Grandfather 84   Skin cancer Cousin      Physical Exam: General:   Alert,  well-nourished, pleasant and cooperative in NAD Head:  Normocephalic and atraumatic. Eyes:  Sclera clear, no icterus.   Conjunctiva pink. Mouth:  No deformity or lesions.   Neck:  Supple; no masses or thyromegaly. Lungs:  Clear throughout to auscultation.   No wheezes. Heart:  Regular rate and rhythm; no murmurs. Abdomen:  Soft, non-tender, nondistended, normal bowel sounds, no rebound or guarding.  Msk:  Symmetrical. No boney deformities LAD: No inguinal or umbilical LAD Extremities:  No clubbing or edema. Neurologic:  Alert and  oriented x4;  grossly nonfocal Skin:  No obvious rash or bruise. Psych:  Alert and cooperative. Normal mood and affect.     Studies/Results: No results found.  Corianna Avallone L. Tarri Glenn, MD, MPH 03/28/2022, 1:19 PM

## 2022-03-29 ENCOUNTER — Telehealth: Payer: Self-pay | Admitting: *Deleted

## 2022-03-29 NOTE — Telephone Encounter (Signed)
Post procedure follow up call placed, no answer and left VM.  

## 2022-03-31 NOTE — Progress Notes (Signed)
51 y.o. G73P2002 Married White or Caucasian Not Hispanic or Latino female here for annual exam.  She is off HRT. No vaginal bleeding. No dyspareunia.    She has had negative genetic testing, TC risk for breast cancer is 8%.   Patient's last menstrual period was 05/01/2019 (exact date).          Sexually active: Yes.    The current method of family planning is post menopausal status.    Exercising: Yes.     Running every 3 days  Smoker:  no  Health Maintenance: Pap:   04/08/21 WNL HR HPV +; 03/28/2018 WNL NEG HPV   History of abnormal Pap:   Yes leep in 07/13/2010 for CIN III  MMG:  05/31/21 Bi-rads 1 neg  BMD:   n/a Colonoscopy: 03/28/22 polyp f/u 5 years  TDaP:  02/27/14 Gardasil: n/a   reports that she has never smoked. She has never used smokeless tobacco. She reports current alcohol use. She reports that she does not use drugs. She and her husband live in [place]. 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 Husband manages many funeral homes. One of her sons died in 07-12-2016 at 36. Other son is now 84. Working in [place].   Past Medical History:  Diagnosis Date   CIN III (cervical intraepithelial neoplasia III)    Family history of breast cancer    Family history of lung cancer    Family history of pancreatic cancer    Family history of prostate cancer    Family history of thyroid cancer     Past Surgical History:  Procedure Laterality Date   AUGMENTATION MAMMAPLASTY  07-12-09   CERVICAL BIOPSY  W/ LOOP ELECTRODE EXCISION  07-13-2010   COLPOSCOPY  07/13/10   LEEP  Jul 13, 2010   CIN 2-3   [place]    No current outpatient medications on file.   No current facility-administered medications for this visit.    Family History  Problem Relation Age of Onset   Hearing loss Mother    Osteoporosis Mother    Miscarriages / [place] Mother    Prostate cancer Father 31   Colon polyps Father 32   Arthritis Father     Hyperlipidemia Father    Diabetes Father    Hypertension Father    Breast cancer Sister 63   Thyroid cancer Sister 41   Depression Sister    Alcohol abuse Sister    Asthma Sister    Depression Brother    Alcohol abuse Brother    Mental illness Brother    Heart failure Maternal Uncle    Pancreatic cancer Paternal Aunt 1   Colon cancer Paternal Uncle    Prostate cancer Paternal Uncle 58       aggressive, died from this cancer about 1 year later   Prostate cancer Paternal Uncle 57       aggressive, died from this cancer   Pulmonary disease Maternal Grandmother    Lung cancer Maternal Grandfather    Prostate cancer Paternal Grandfather 59   Skin cancer Cousin     Review of Systems  All other systems reviewed and are negative.   Exam:   BP 120/70   Pulse 65   Ht 5' 5.75" (1.67 m)   Wt 150 lb (68 kg)   LMP 05/01/2019 (Exact Date)   SpO2 100%   BMI 24.40 kg/m   Weight  change: '@WEIGHTCHANGE'$ @ Height:   Height: 5' 5.75" (167 cm)  Ht Readings from Last 3 Encounters:  04/12/22 5' 5.75" (1.67 m)  03/28/22 '5\' 7"'$  (1.702 m)  03/14/22 '5\' 7"'$  (1.702 m)    General appearance: alert, cooperative and appears stated age Head: Normocephalic, without obvious abnormality, atraumatic Neck: no adenopathy, supple, symmetrical, trachea midline and thyroid normal to inspection and palpation Lungs: clear to auscultation bilaterally Cardiovascular: regular rate and rhythm Breasts: normal appearance, no masses or tenderness Abdomen: soft, non-tender; non distended,  no masses,  no organomegaly Extremities: extremities normal, atraumatic, no cyanosis or edema Skin: Skin color, texture, turgor normal. No rashes or lesions Lymph nodes: Cervical, supraclavicular, and axillary nodes normal. No abnormal inguinal nodes palpated Neurologic: Grossly normal   Pelvic: External genitalia:  no lesions              Urethra:  normal appearing urethra with no masses, tenderness or lesions               Bartholins and Skenes: normal                 Vagina: normal appearing vagina with normal color and discharge, no lesions              Cervix: no lesions and stenotic               Bimanual Exam:  Uterus:  normal size, contour, position, consistency, mobility, non-tender              Adnexa: no mass, fullness, tenderness               Rectovaginal: Confirms               Anus:  normal sphincter tone, no lesions  Gae Dry, CMA chaperoned for the exam.  1. Well woman exam Discussed breast self exam Discussed calcium and vit D intake Mammogram in 2/24 Colonoscopy UTD  Labs with primary  2. Screening for cervical cancer - Cytology - PAP

## 2022-04-04 ENCOUNTER — Encounter: Payer: Self-pay | Admitting: Gastroenterology

## 2022-04-12 ENCOUNTER — Other Ambulatory Visit (HOSPITAL_COMMUNITY)
Admission: RE | Admit: 2022-04-12 | Discharge: 2022-04-12 | Disposition: A | Discharge status: Home / Self Care | Payer: BC Managed Care – PPO | Source: Ambulatory Visit | Attending: Obstetrics and Gynecology | Admitting: Obstetrics and Gynecology

## 2022-04-12 ENCOUNTER — Ambulatory Visit (INDEPENDENT_AMBULATORY_CARE_PROVIDER_SITE_OTHER): Payer: BC Managed Care – PPO | Admitting: Obstetrics and Gynecology

## 2022-04-12 ENCOUNTER — Encounter: Payer: Self-pay | Admitting: Obstetrics and Gynecology

## 2022-04-12 VITALS — BP 120/70 | HR 65 | Ht 65.75 in | Wt 150.0 lb

## 2022-04-12 DIAGNOSIS — Z01419 Encounter for gynecological examination (general) (routine) without abnormal findings: Secondary | ICD-10-CM | POA: Diagnosis not present

## 2022-04-12 DIAGNOSIS — Z124 Encounter for screening for malignant neoplasm of cervix: Secondary | ICD-10-CM

## 2022-04-12 NOTE — Patient Instructions (Signed)

## 2022-04-14 LAB — CYTOLOGY - PAP
Adequacy: ABSENT
Comment: NEGATIVE
Diagnosis: NEGATIVE
High risk HPV: NEGATIVE

## 2022-04-26 ENCOUNTER — Other Ambulatory Visit: Payer: Self-pay | Admitting: Obstetrics and Gynecology

## 2022-04-26 DIAGNOSIS — Z1231 Encounter for screening mammogram for malignant neoplasm of breast: Secondary | ICD-10-CM

## 2022-06-15 ENCOUNTER — Ambulatory Visit
Admission: RE | Admit: 2022-06-15 | Discharge: 2022-06-15 | Disposition: A | Discharge status: Home / Self Care | Payer: BC Managed Care – PPO | Source: Ambulatory Visit | Attending: Obstetrics and Gynecology | Admitting: Obstetrics and Gynecology

## 2022-06-15 DIAGNOSIS — Z1231 Encounter for screening mammogram for malignant neoplasm of breast: Secondary | ICD-10-CM

## 2023-01-11 IMAGING — MG DIGITAL SCREENING BREAST BILAT IMPLANT W/ TOMO W/ CAD
8 of 12 series · 8 of 28 positions shown · non-contrast
Comparison: Previous exam(s).

CLINICAL DATA: Screening.

EXAM:
DIGITAL SCREENING BILATERAL MAMMOGRAM WITH IMPLANTS, CAD AND
TOMOSYNTHESIS
TECHNIQUE: Bilateral screening digital craniocaudal and mediolateral oblique
mammograms were obtained. Bilateral screening digital breast
tomosynthesis was performed. The images were evaluated with
computer-aided detection. Standard and/or implant displaced views
were performed.

[L MLO]
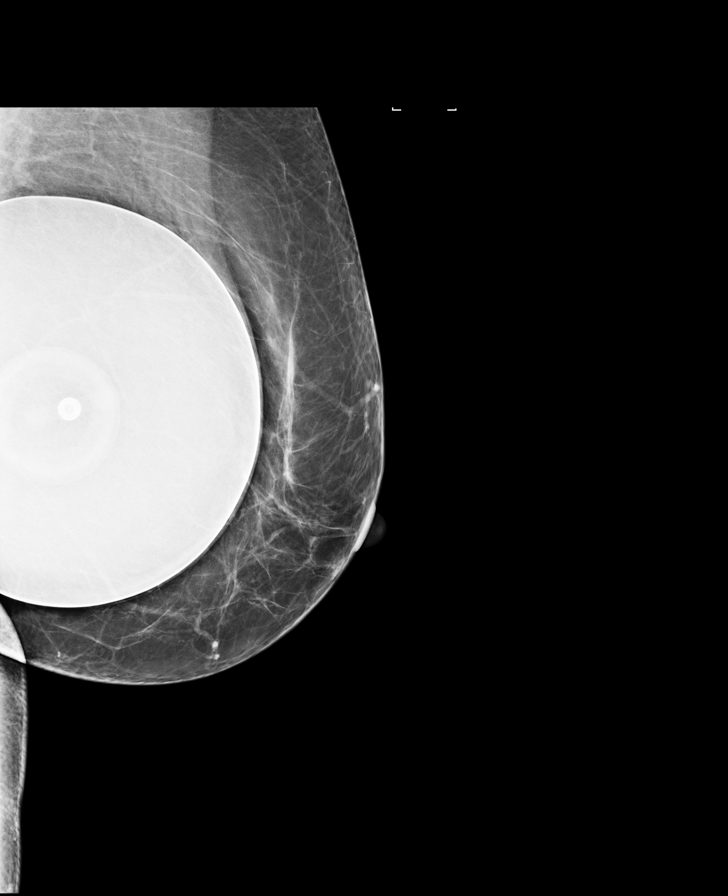

[R MLO]
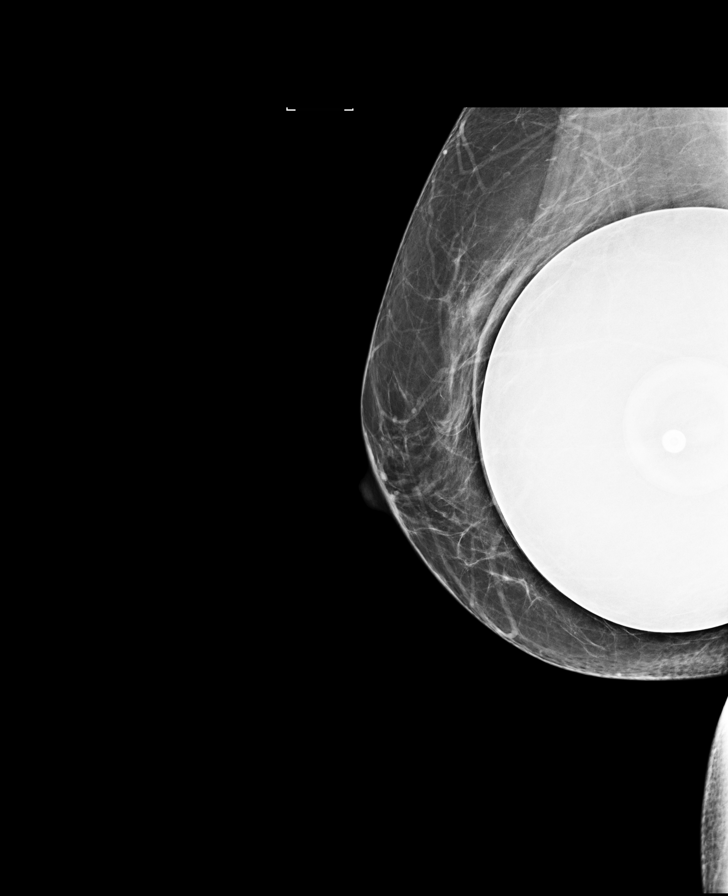

[R CC]
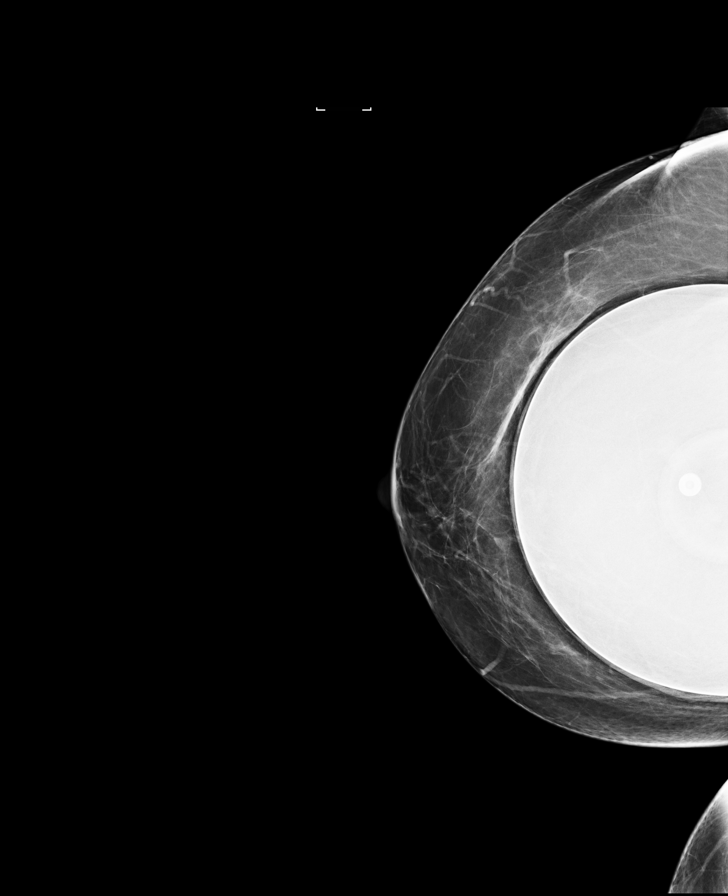

[L CC]
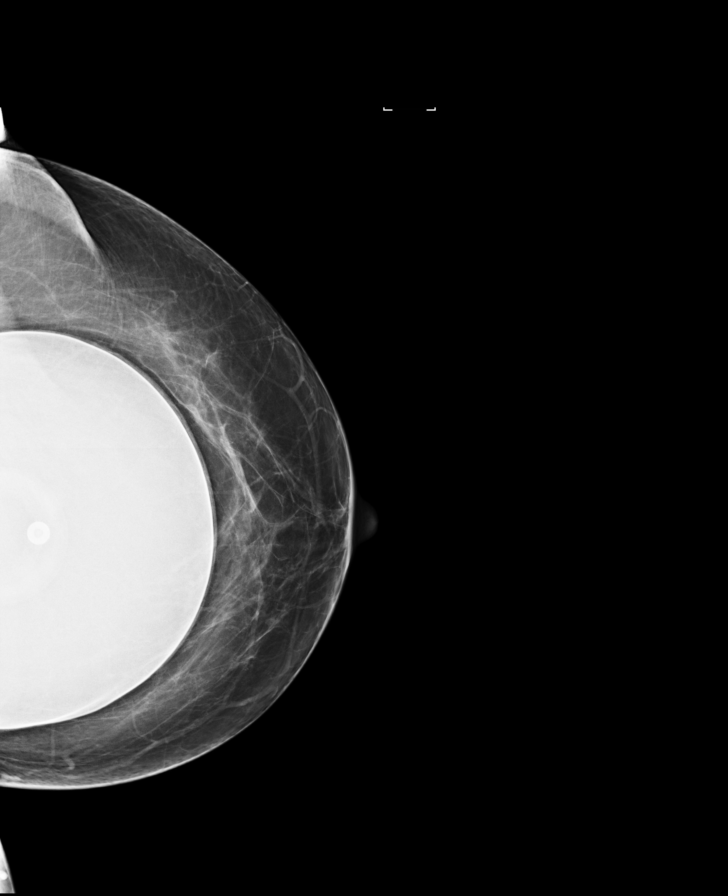

[L CC synth-2D]
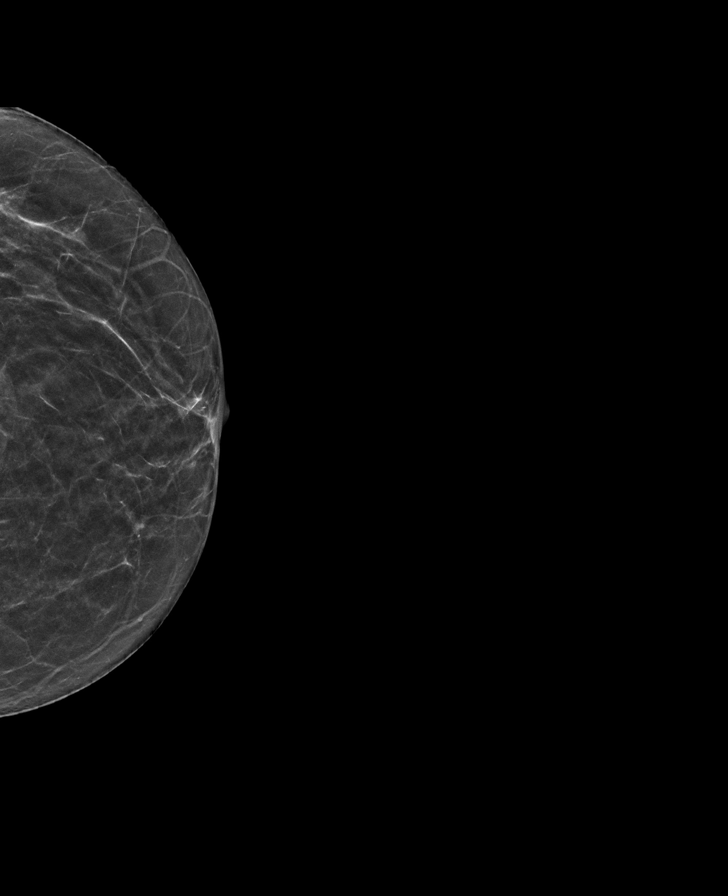

[R MLO synth-2D]
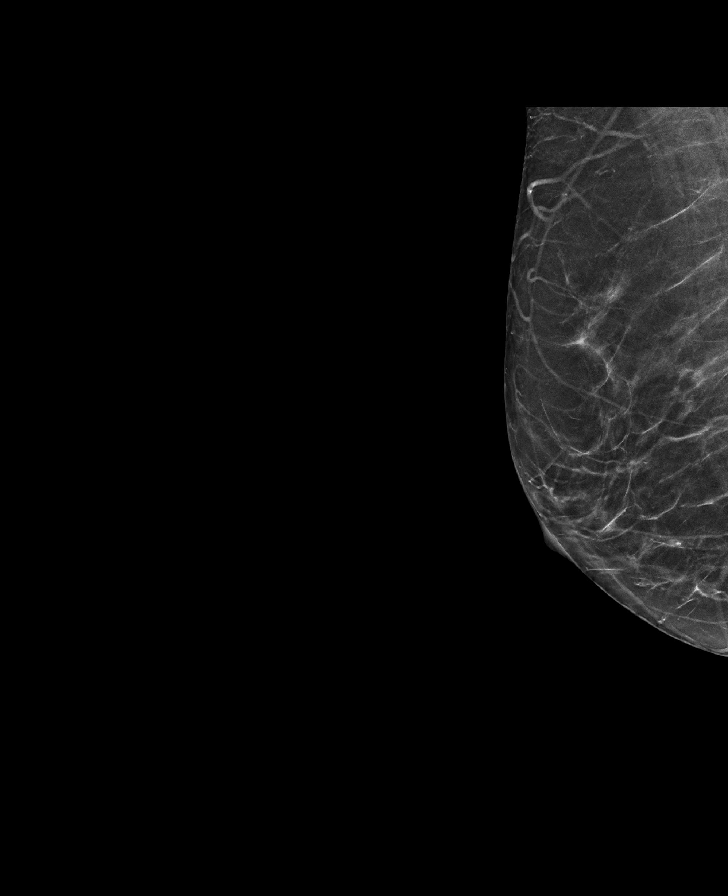

[L MLO synth-2D]
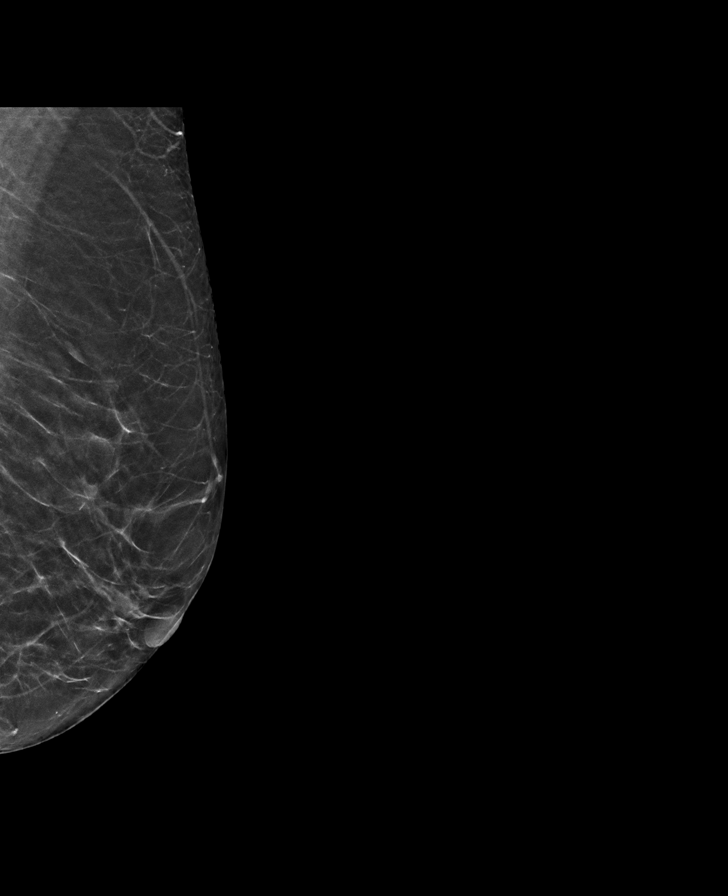

[R CC synth-2D]
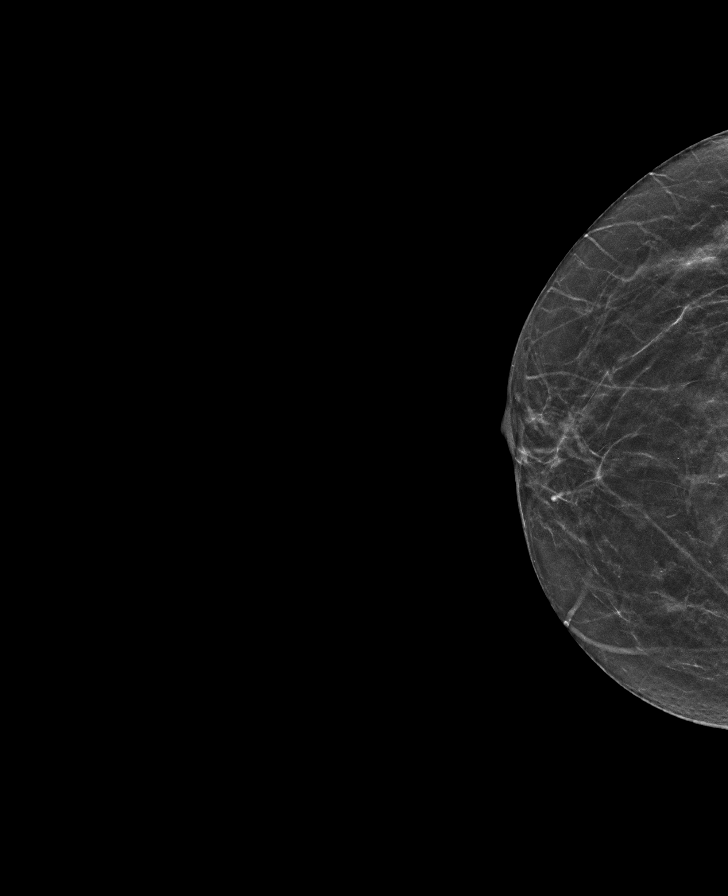

[8 of 28 positions shown; findings below may reference images not displayed]

ACR Breast Density Category b: There are scattered areas of
fibroglandular density.
FINDINGS: The patient has retropectoral implants. There are no findings
suspicious for malignancy.
IMPRESSION: No mammographic evidence of malignancy. A result letter of this
screening mammogram will be mailed directly to the patient.

RECOMMENDATION:
Screening mammogram in one year. (Code:[id number])

BI-RADS CATEGORY  1:  Negative.

## 2023-04-24 ENCOUNTER — Encounter: Payer: Self-pay | Admitting: Obstetrics and Gynecology

## 2023-04-24 ENCOUNTER — Other Ambulatory Visit (HOSPITAL_COMMUNITY)
Admission: RE | Admit: 2023-04-24 | Discharge: 2023-04-24 | Disposition: A | Discharge status: Home / Self Care | Payer: BC Managed Care – PPO | Source: Ambulatory Visit | Attending: Obstetrics and Gynecology | Admitting: Obstetrics and Gynecology

## 2023-04-24 ENCOUNTER — Ambulatory Visit (INDEPENDENT_AMBULATORY_CARE_PROVIDER_SITE_OTHER): Payer: BC Managed Care – PPO | Admitting: Obstetrics and Gynecology

## 2023-04-24 VITALS — BP 102/70 | HR 75 | Ht 66.0 in | Wt 159.0 lb

## 2023-04-24 DIAGNOSIS — Z124 Encounter for screening for malignant neoplasm of cervix: Secondary | ICD-10-CM

## 2023-04-24 DIAGNOSIS — Z01419 Encounter for gynecological examination (general) (routine) without abnormal findings: Secondary | ICD-10-CM | POA: Insufficient documentation

## 2023-04-24 DIAGNOSIS — E559 Vitamin D deficiency, unspecified: Secondary | ICD-10-CM

## 2023-04-24 DIAGNOSIS — Z13220 Encounter for screening for lipoid disorders: Secondary | ICD-10-CM

## 2023-04-24 DIAGNOSIS — Z131 Encounter for screening for diabetes mellitus: Secondary | ICD-10-CM

## 2023-04-24 NOTE — Progress Notes (Signed)
52 y.o. [id number] postmenopausal female with history of CIN-3 status post LEEP (2012) significant family history of multiple cancers  (2020, negative genetic testing) here for annual exam. Married.  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  Pt states she has hot flashes at times, but they are manageable.  She is not interested in HRT.  Patient's last menstrual period was 05/01/2019 (exact date).   Abnormal bleeding: none Pelvic discharge or pain: none Breast mass, nipple discharge or skin changes : none  Last PAP:     Component Value Date/Time   DIAGPAP  04/12/2022 1632    - Negative for intraepithelial lesion or malignancy (NILM)   DIAGPAP  04/08/2021 1649    - Negative for intraepithelial lesion or malignancy (NILM)   DIAGPAP  03/28/2018 0000    NEGATIVE FOR INTRAEPITHELIAL LESIONS OR MALIGNANCY.   HPVHIGH Negative 04/12/2022 1632   HPVHIGH Positive (A) 04/08/2021 1649   ADEQPAP  04/12/2022 1632    Satisfactory for evaluation; transformation zone component ABSENT.   ADEQPAP  04/08/2021 1649    Satisfactory for evaluation; transformation zone component ABSENT.   ADEQPAP  03/28/2018 0000    Satisfactory for evaluation  endocervical/transformation zone component PRESENT.   Last mammogram: 06/15/2022 BI-RADS 1, density B Last colonoscopy: 03/28/22 polyp f/u 5 years  Sexually active: yes  Exercising: yes, walking Smoker: No  GYN HISTORY: Family history of breast, thyroid, prostate, lung, pancreatic cancer, seen by genetic counselor in 2020 with negative genetic testing, TC breast cancer risk 8%  OB History  Gravida Para Term Preterm AB Living  2 2 2   2   SAB IAB Ectopic Multiple Live Births      2    # Outcome Date GA Lbr Len/2nd Weight Sex Type Anes PTL Lv  2 Term      Vag-Spont   LIV  1 Term      Vag-Spont   LIV    Past Medical History:  Diagnosis Date   CIN III (cervical intraepithelial neoplasia III)    Family history of breast cancer     Family history of lung cancer    Family history of pancreatic cancer    Family history of prostate cancer    Family history of thyroid cancer     Past Surgical History:  Procedure Laterality Date   AUGMENTATION MAMMAPLASTY  2011   CERVICAL BIOPSY  W/ LOOP ELECTRODE EXCISION  2012   COLPOSCOPY  2012   LEEP  2012   CIN 2-3   WISDOM TOOTH EXTRACTION  1996    No current outpatient medications on file prior to visit.   No current facility-administered medications on file prior to visit.    Social History   Socioeconomic History   Marital status: Married    Spouse name: Not on file   Number of children: Not on file   Years of education: Not on file   Highest education level: Not on file  Occupational History   Not on file  Tobacco Use   Smoking status: Never   Smokeless tobacco: Never  Vaping Use   Vaping status: Never Used  Substance and Sexual Activity   Alcohol use: Yes    Alcohol/week: 0.0 - 4.0 standard drinks of alcohol   Drug use: No   Sexual activity: Yes    Partners: Male    Birth control/protection: None    Comment: husband had vasectomy   Other Topics Concern   Not on file  Social History Narrative   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: Lives with husband      Spiritual Beliefs: Christian      Lifestyle: exercises at the gym 4 days per week; diet is healthy      Social Drivers of Corporate investment banker Strain: Not on file  Food Insecurity: Not on file  Transportation Needs: Not on file  Physical Activity: Not on file  Stress: Not on file  Social Connections: Not on file  Intimate Partner Violence: Not on file    Family History  Problem Relation Age of Onset   Hearing loss Mother    Osteoporosis Mother    Miscarriages / [place] Mother    Prostate cancer Father 64   Colon polyps Father 59   Arthritis Father    Hyperlipidemia Father    Diabetes Father    Hypertension Father    Breast cancer Sister 73    Thyroid cancer Sister 71   Depression Sister    Alcohol abuse Sister    Asthma Sister    Depression Brother    Alcohol abuse Brother    Mental illness Brother    Heart failure Maternal Uncle    Pancreatic cancer Paternal Aunt 41   Colon cancer Paternal Uncle    Prostate cancer Paternal Uncle 2       aggressive, died from this cancer about 1 year later   Prostate cancer Paternal Uncle 76       aggressive, died from this cancer   Pulmonary disease Maternal Grandmother    Lung cancer Maternal Grandfather    Prostate cancer Paternal Grandfather 33   Skin cancer Cousin     Allergies  Allergen Reactions   Poison Ivy Extract [Poison Ivy Extract] Rash      PE Today's Vitals   04/24/23 1055  BP: 102/70  Pulse: 75  SpO2: 97%  Weight: 159 lb (72.1 kg)  Height: 5\' 6"  (1.676 m)   Body mass index is 25.66 kg/m.  Physical Exam Vitals reviewed. Exam conducted with a chaperone present.  Constitutional:      General: She is not in acute distress.    Appearance: Normal appearance.  HENT:     Head: Normocephalic and atraumatic.     Nose: Nose normal.  Eyes:     Extraocular Movements: Extraocular movements intact.     Conjunctiva/sclera: Conjunctivae normal.  Neck:     Thyroid: No thyroid mass, thyromegaly or thyroid tenderness.  Pulmonary:     Effort: Pulmonary effort is normal.  Chest:     Chest wall: No mass or tenderness.  Breasts:    Right: Normal. No swelling, mass, nipple discharge, skin change or tenderness.     Left: Normal. No swelling, mass, nipple discharge, skin change or tenderness.     Comments: Bilateral implants Abdominal:     General: There is no distension.     Palpations: Abdomen is soft.     Tenderness: There is no abdominal tenderness.  Genitourinary:    General: Normal vulva.     Exam position: Lithotomy position.     Urethra: No prolapse.     Vagina: Normal. No vaginal discharge or bleeding.     Cervix: Normal. No lesion.     Uterus: Normal.  Not enlarged and not tender.      Adnexa: Right adnexa normal and left adnexa normal.  Musculoskeletal:        General: Normal range of  motion.     Cervical back: Normal range of motion.  Lymphadenopathy:     Upper Body:     Right upper body: No axillary adenopathy.     Left upper body: No axillary adenopathy.     Lower Body: No right inguinal adenopathy. No left inguinal adenopathy.  Skin:    General: Skin is warm and dry.  Neurological:     General: No focal deficit present.     Mental Status: She is alert.  Psychiatric:        Mood and Affect: Mood normal.        Behavior: Behavior normal.       Assessment and Plan:        Cervical cancer screening -     Cytology - PAP  Well woman exam with routine gynecological exam Assessment & Plan: Cervical cancer screening performed according to ASCCP guidelines. Encouraged annual mammogram screening Colonoscopy UTD DXA N/A Labs and immunizations with her primary Encouraged safe sexual practices as indicated Encouraged healthy lifestyle practices with diet and exercise For patients under 50-70yo, I recommend 1200mg  calcium daily and 600IU of vitamin D daily.   Orders: -     Thyroid Panel With TSH -     CBC -     COMPLETE METABOLIC PANEL WITH GFR  Lipid screening -     Lipid panel  Diabetes mellitus screening -     Hemoglobin A1C w/out eAG  Vitamin D deficiency -     VITAMIN D 25 Hydroxy (Vit-D Deficiency, Fractures)    Rosalyn Gess, MD

## 2023-04-24 NOTE — Patient Instructions (Addendum)
For patients under 50-52yo, I recommend 1200mg  calcium daily and 600IU of vitamin D daily. For patients over 52yo, I recommend 1200mg  calcium daily and 800IU of vitamin D daily.  Health Maintenance, Female Adopting a healthy lifestyle and getting preventive care are important in promoting health and wellness. Ask your health care provider about: The right schedule for you to have regular tests and exams. Things you can do on your own to prevent diseases and keep yourself healthy. What should I know about diet, weight, and exercise? Eat a healthy diet  Eat a diet that includes plenty of vegetables, fruits, low-fat dairy products, and lean protein. Do not eat a lot of foods that are high in solid fats, added sugars, or sodium. Maintain a healthy weight Body mass index (BMI) is used to identify weight problems. It estimates body fat based on height and weight. Your health care provider can help determine your BMI and help you achieve or maintain a healthy weight. Get regular exercise Get regular exercise. This is one of the most important things you can do for your health. Most adults should: Exercise for at least 150 minutes each week. The exercise should increase your heart rate and make you sweat (moderate-intensity exercise). Do strengthening exercises at least twice a week. This is in addition to the moderate-intensity exercise. Spend less time sitting. Even light physical activity can be beneficial. Watch cholesterol and blood lipids Have your blood tested for lipids and cholesterol at 52 years of age, then have this test every 5 years. Have your cholesterol levels checked more often if: Your lipid or cholesterol levels are high. You are older than 52 years of age. You are at high risk for heart disease. What should I know about cancer screening? Depending on your health history and family history, you may need to have cancer screening at various ages. This may include screening  for: Breast cancer. Cervical cancer. Colorectal cancer. Skin cancer. Lung cancer. What should I know about heart disease, diabetes, and high blood pressure? Blood pressure and heart disease High blood pressure causes heart disease and increases the risk of stroke. This is more likely to develop in people who have high blood pressure readings or are overweight. Have your blood pressure checked: Every 3-5 years if you are 83-52 years of age. Every year if you are 4 years old or older. Diabetes Have regular diabetes screenings. This checks your fasting blood sugar level. Have the screening done: Once every three years after age 68 if you are at a normal weight and have a low risk for diabetes. More often and at a younger age if you are overweight or have a high risk for diabetes. What should I know about preventing infection? Hepatitis B If you have a higher risk for hepatitis B, you should be screened for this virus. Talk with your health care provider to find out if you are at risk for hepatitis B infection. Hepatitis C Testing is recommended for: Everyone born from [birth year] through 1965. Anyone with known risk factors for hepatitis C. Sexually transmitted infections (STIs) Get screened for STIs, including gonorrhea and chlamydia, if: You are sexually active and are younger than 52 years of age. You are older than 52 years of age and your health care provider tells you that you are at risk for this type of infection. Your sexual activity has changed since you were last screened, and you are at increased risk for chlamydia or gonorrhea. Ask your health care provider if  you are at risk. Ask your health care provider about whether you are at high risk for HIV. Your health care provider may recommend a prescription medicine to help prevent HIV infection. If you choose to take medicine to prevent HIV, you should first get tested for HIV. You should then be tested every 3 months for as long as you  are taking the medicine. Osteoporosis and menopause Osteoporosis is a disease in which the bones lose minerals and strength with aging. This can result in bone fractures. If you are 44 years old or older, or if you are at risk for osteoporosis and fractures, ask your health care provider if you should: Be screened for bone loss. Take a calcium or vitamin D supplement to lower your risk of fractures. Be given hormone replacement therapy (HRT) to treat symptoms of menopause. Follow these instructions at home: Alcohol use Do not drink alcohol if: Your health care provider tells you not to drink. You are pregnant, may be pregnant, or are planning to become pregnant. If you drink alcohol: Limit how much you have to: 0-1 drink a day. Know how much alcohol is in your drink. In the [place] one drink equals one 12 oz bottle of beer (355 mL), one 5 oz glass of wine (148 mL), or one 1 oz glass of hard liquor (44 mL). Lifestyle Do not use any products that contain nicotine or tobacco. These products include cigarettes, chewing tobacco, and vaping devices, such as e-cigarettes. If you need help quitting, ask your health care provider. Do not use street drugs. Do not share needles. Ask your health care provider for help if you need support or information about quitting drugs. General instructions Schedule regular health, dental, and eye exams. Stay current with your vaccines. Tell your health care provider if: You often feel depressed. You have ever been abused or do not feel safe at home. Summary Adopting a healthy lifestyle and getting preventive care are important in promoting health and wellness. Follow your health care provider's instructions about healthy diet, exercising, and getting tested or screened for diseases. Follow your health care provider's instructions on monitoring your cholesterol and blood pressure. This information is not intended to replace advice given to you by your health  care provider. Make sure you discuss any questions you have with your health care provider. Document Revised: 08/31/2020 Document Reviewed: 08/31/2020 Elsevier Patient Education  2024 [employer].

## 2023-04-24 NOTE — Assessment & Plan Note (Signed)
 Cervical cancer screening performed according to ASCCP guidelines. Encouraged annual mammogram screening Colonoscopy UTD DXA N/A Labs and immunizations with her primary Encouraged safe sexual practices as indicated Encouraged healthy lifestyle practices with diet and exercise For patients under 50-52yo, I recommend 1200mg  calcium daily and 600IU of vitamin D daily.

## 2023-04-25 LAB — THYROID PANEL WITH TSH
Free Thyroxine Index: 2.2 (ref 1.4–3.8)
T3 Uptake: 30 % (ref 22–35)
T4, Total: 7.4 ug/dL (ref 5.1–11.9)
TSH: 1.32 m[IU]/L

## 2023-04-25 LAB — HEMOGLOBIN A1C W/OUT EAG: Hgb A1c MFr Bld: 5.5 %{Hb} (ref <5.7)

## 2023-04-25 LAB — COMPLETE METABOLIC PANEL WITH GFR
AG Ratio: 2 (calc) (ref 1.0–2.5)
ALT: 18 U/L (ref 6–29)
AST: 18 U/L (ref 10–35)
Albumin: 4.9 g/dL (ref 3.6–5.1)
Alkaline phosphatase (APISO): 111 U/L (ref 37–153)
BUN: 13 mg/dL (ref 7–25)
CO2: 28 mmol/L (ref 20–32)
Calcium: 9.8 mg/dL (ref 8.6–10.4)
Chloride: 101 mmol/L (ref 98–110)
Creat: 0.92 mg/dL (ref 0.50–1.03)
Globulin: 2.4 g/dL (ref 1.9–3.7)
Glucose, Bld: 92 mg/dL (ref 65–99)
Potassium: 4.2 mmol/L (ref 3.5–5.3)
Sodium: 138 mmol/L (ref 135–146)
Total Bilirubin: 0.5 mg/dL (ref 0.2–1.2)
Total Protein: 7.3 g/dL (ref 6.1–8.1)
eGFR: 75 mL/min/{1.73_m2} (ref >=60)

## 2023-04-25 LAB — CBC
HCT: 42.6 % (ref 35.0–45.0)
Hemoglobin: 14.6 g/dL (ref 11.7–15.5)
MCH: 32.2 pg (ref 27.0–33.0)
MCHC: 34.3 g/dL (ref 32.0–36.0)
MCV: 93.8 fL (ref 80.0–100.0)
MPV: 9 fL (ref 7.5–12.5)
Platelets: 288 10*3/uL (ref 140–400)
RBC: 4.54 10*6/uL (ref 3.80–5.10)
RDW: 12.1 % (ref 11.0–15.0)
WBC: 5.8 10*3/uL (ref 3.8–10.8)

## 2023-04-25 LAB — LIPID PANEL
Cholesterol: 286 mg/dL — ABNORMAL HIGH (ref <200)
HDL: 93 mg/dL (ref >=50)
LDL Cholesterol (Calc): 173 mg/dL — ABNORMAL HIGH
Non-HDL Cholesterol (Calc): 193 mg/dL — ABNORMAL HIGH (ref <130)
Total CHOL/HDL Ratio: 3.1 (calc) (ref <5.0)
Triglycerides: 87 mg/dL (ref <150)

## 2023-04-25 LAB — VITAMIN D 25 HYDROXY (VIT D DEFICIENCY, FRACTURES): Vit D, 25-Hydroxy: 21 ng/mL — ABNORMAL LOW (ref 30–100)

## 2023-04-28 ENCOUNTER — Encounter: Payer: Self-pay | Admitting: Obstetrics and Gynecology

## 2023-04-28 LAB — CYTOLOGY - PAP
Adequacy: ABSENT
Comment: NEGATIVE
Diagnosis: NEGATIVE
High risk HPV: NEGATIVE

## 2023-05-01 DIAGNOSIS — E559 Vitamin D deficiency, unspecified: Secondary | ICD-10-CM | POA: Insufficient documentation

## 2023-05-01 MED ORDER — VITAMIN D 50 MCG (2000 UT) PO TABS: 2000.0000 [IU] | ORAL_TABLET | Freq: Every day | ORAL | 3 refills | Status: AC

## 2023-05-01 NOTE — Telephone Encounter (Signed)
 See result notes.  Encounter closed.

## 2023-05-01 NOTE — Telephone Encounter (Signed)
 Routing to Dr. Kennith Center to review and advise on 04/24/23 lab results.

## 2023-05-02 ENCOUNTER — Other Ambulatory Visit: Payer: Self-pay | Admitting: Obstetrics and Gynecology

## 2023-05-02 DIAGNOSIS — Z1231 Encounter for screening mammogram for malignant neoplasm of breast: Secondary | ICD-10-CM

## 2023-06-07 ENCOUNTER — Telehealth: Payer: Self-pay | Admitting: Family Medicine

## 2023-06-22 ENCOUNTER — Ambulatory Visit
Admission: RE | Admit: 2023-06-22 | Discharge: 2023-06-22 | Disposition: A | Discharge status: Home / Self Care | Payer: 59 | Source: Ambulatory Visit | Attending: Obstetrics and Gynecology

## 2023-06-22 DIAGNOSIS — Z1231 Encounter for screening mammogram for malignant neoplasm of breast: Secondary | ICD-10-CM

## 2023-06-26 ENCOUNTER — Encounter: Payer: Self-pay | Admitting: Obstetrics and Gynecology

## 2024-04-09 ENCOUNTER — Other Ambulatory Visit: Payer: Self-pay | Admitting: Obstetrics and Gynecology

## 2024-04-09 DIAGNOSIS — Z1231 Encounter for screening mammogram for malignant neoplasm of breast: Secondary | ICD-10-CM

## 2024-04-24 ENCOUNTER — Ambulatory Visit: Admitting: Obstetrics and Gynecology

## 2024-05-01 ENCOUNTER — Encounter: Payer: Self-pay | Admitting: Obstetrics and Gynecology

## 2024-05-01 ENCOUNTER — Ambulatory Visit: Admitting: Obstetrics and Gynecology

## 2024-05-01 VITALS — BP 100/64 | HR 78 | Temp 98.2°F | Ht 67.0 in | Wt 167.0 lb

## 2024-05-01 DIAGNOSIS — Z01419 Encounter for gynecological examination (general) (routine) without abnormal findings: Secondary | ICD-10-CM

## 2024-05-01 DIAGNOSIS — Z1331 Encounter for screening for depression: Secondary | ICD-10-CM

## 2024-05-01 NOTE — Patient Instructions (Signed)
 For patients under 50-54yo, I recommend 1200mg  calcium  daily and 600IU of vitamin D daily. For patients over 54yo, I recommend 1200mg  calcium  daily and 800IU of vitamin D daily.  Health Maintenance, Female Adopting a healthy lifestyle and getting preventive care are important in promoting health and wellness. Ask your health care provider about: The right schedule for you to have regular tests and exams. Things you can do on your own to prevent diseases and keep yourself healthy. What should I know about diet, weight, and exercise? Eat a healthy diet  Eat a diet that includes plenty of vegetables, fruits, low-fat dairy products, and lean protein. Do not eat a lot of foods that are high in solid fats, added sugars, or sodium. Maintain a healthy weight Body mass index (BMI) is used to identify weight problems. It estimates body fat based on height and weight. Your health care provider can help determine your BMI and help you achieve or maintain a healthy weight. Get regular exercise Get regular exercise. This is one of the most important things you can do for your health. Most adults should: Exercise for at least 150 minutes each week. The exercise should increase your heart rate and make you sweat (moderate-intensity exercise). Do strengthening exercises at least twice a week. This is in addition to the moderate-intensity exercise. Spend less time sitting. Even light physical activity can be beneficial. Watch cholesterol and blood lipids Have your blood tested for lipids and cholesterol at 54 years of age, then have this test every 5 years. Have your cholesterol levels checked more often if: Your lipid or cholesterol levels are high. You are older than 54 years of age. You are at high risk for heart disease. What should I know about cancer screening? Depending on your health history and family history, you may need to have cancer screening at various ages. This may include screening  for: Breast cancer. Cervical cancer. Colorectal cancer. Skin cancer. Lung cancer. What should I know about heart disease, diabetes, and high blood pressure? Blood pressure and heart disease High blood pressure causes heart disease and increases the risk of stroke. This is more likely to develop in people who have high blood pressure readings or are overweight. Have your blood pressure checked: Every 3-5 years if you are 25-57 years of age. Every year if you are 24 years old or older. Diabetes Have regular diabetes screenings. This checks your fasting blood sugar level. Have the screening done: Once every three years after age 62 if you are at a normal weight and have a low risk for diabetes. More often and at a younger age if you are overweight or have a high risk for diabetes. What should I know about preventing infection? Hepatitis B If you have a higher risk for hepatitis B, you should be screened for this virus. Talk with your health care provider to find out if you are at risk for hepatitis B infection. Hepatitis C Testing is recommended for: Everyone born from [birth year] through 1965. Anyone with known risk factors for hepatitis C. Sexually transmitted infections (STIs) Get screened for STIs, including gonorrhea and chlamydia, if: You are sexually active and are younger than 54 years of age. You are older than 54 years of age and your health care provider tells you that you are at risk for this type of infection. Your sexual activity has changed since you were last screened, and you are at increased risk for chlamydia or gonorrhea. Ask your health care provider if  you are at risk. Ask your health care provider about whether you are at high risk for HIV. Your health care provider may recommend a prescription medicine to help prevent HIV infection. If you choose to take medicine to prevent HIV, you should first get tested for HIV. You should then be tested every 3 months for as long as you  are taking the medicine. Osteoporosis and menopause Osteoporosis is a disease in which the bones lose minerals and strength with aging. This can result in bone fractures. If you are 72 years old or older, or if you are at risk for osteoporosis and fractures, ask your health care provider if you should: Be screened for bone loss. Take a calcium  or vitamin D supplement to lower your risk of fractures. Be given hormone replacement therapy (HRT) to treat symptoms of menopause. Follow these instructions at home: Alcohol use Do not drink alcohol if: Your health care provider tells you not to drink. You are pregnant, may be pregnant, or are planning to become pregnant. If you drink alcohol: Limit how much you have to: 0-1 drink a day. Know how much alcohol is in your drink. In the [place] one drink equals one 12 oz bottle of beer (355 mL), one 5 oz glass of wine (148 mL), or one 1 oz glass of hard liquor (44 mL). Lifestyle Do not use any products that contain nicotine or tobacco. These products include cigarettes, chewing tobacco, and vaping devices, such as e-cigarettes. If you need help quitting, ask your health care provider. Do not use street drugs. Do not share needles. Ask your health care provider for help if you need support or information about quitting drugs. General instructions Schedule regular health, dental, and eye exams. Stay current with your vaccines. Tell your health care provider if: You often feel depressed. You have ever been abused or do not feel safe at home. Summary Adopting a healthy lifestyle and getting preventive care are important in promoting health and wellness. Follow your health care provider's instructions about healthy diet, exercising, and getting tested or screened for diseases. Follow your health care provider's instructions on monitoring your cholesterol and blood pressure. This information is not intended to replace advice given to you by your health  care provider. Make sure you discuss any questions you have with your health care provider. Document Revised: 08/31/2020 Document Reviewed: 08/31/2020 Elsevier Patient Education  2024 [employer].

## 2024-05-01 NOTE — Progress Notes (Signed)
 "  54 y.o. [id number] postmenopausal female with history of CIN-3 status post LEEP (2012), significant family history of multiple cancers  (2020, negative genetic testing), vitamin D  insufficiency here for annual exam. Married.  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 PCP: Mannie Raisin  Patient's last menstrual period was 05/01/2019.  She reports no other concerns. Labs checked by PCP 04/09/25, vit D 26, cholesterol panel improving. Down 8lb since last year!  Feels she has had difficulty maintaining weight since 54 years old.  Used to use [employer] but got rid of them.  Abnormal bleeding: none Pelvic discharge or pain: none Breast mass, nipple discharge or skin changes : none  Last PAP:     Component Value Date/Time   DIAGPAP  04/24/2023 1155    - Negative for intraepithelial lesion or malignancy (NILM)   DIAGPAP  04/12/2022 1632    - Negative for intraepithelial lesion or malignancy (NILM)   DIAGPAP  04/08/2021 1649    - Negative for intraepithelial lesion or malignancy (NILM)   HPVHIGH Negative 04/24/2023 1155   HPVHIGH Negative 04/12/2022 1632   HPVHIGH Positive (A) 04/08/2021 1649   ADEQPAP  04/24/2023 1155    Satisfactory for evaluation; transformation zone component ABSENT.   ADEQPAP  04/12/2022 1632    Satisfactory for evaluation; transformation zone component ABSENT.   ADEQPAP  04/08/2021 1649    Satisfactory for evaluation; transformation zone component ABSENT.   Last mammogram: 06/22/23 Birads 1, Density B, scheduled 06/25/2024  Last colonoscopy: 03/28/22 polyp f/u 5 years   Sexually active: yes  Exercising: yes, walking Smoker: No  Flowsheet Row Office Visit from 05/01/2024 in [facility] [place]  PHQ-2 Total Score 0    GYN HISTORY: Family history of breast, thyroid , prostate, lung, pancreatic cancer, seen by genetic counselor in 2020 with negative genetic testing, TC breast cancer risk 8%  OB History   Gravida Para Term Preterm AB Living  2 2 2   2   SAB IAB Ectopic Multiple Live Births      2    # Outcome Date GA Lbr Len/2nd Weight Sex Type Anes PTL Lv  2 Term      Vag-Spont   LIV  1 Term      Vag-Spont   LIV    Past Medical History:  Diagnosis Date   CIN III (cervical intraepithelial neoplasia III)    Family history of breast cancer    Family history of lung cancer    Family history of pancreatic cancer    Family history of prostate cancer    Family history of thyroid  cancer     Past Surgical History:  Procedure Laterality Date   AUGMENTATION MAMMAPLASTY  2011   CERVICAL BIOPSY  W/ LOOP ELECTRODE EXCISION  2012   COLPOSCOPY  2012   LEEP  2012   CIN 2-3   WISDOM TOOTH EXTRACTION  1996    Current Outpatient Medications on File Prior to Visit  Medication Sig Dispense Refill   amoxicillin-clavulanate (AUGMENTIN) 875-125 MG tablet Take 1 tablet every 12 hours by oral route for 5 days.     Cholecalciferol (VITAMIN D ) 50 MCG (2000 UT) tablet Take 1 tablet (2,000 Units total) by mouth daily. 90 tablet 3   Magnesium Oxide -Mg Supplement 400 MG CAPS Take by oral route.     No current facility-administered medications on file prior to visit.    Social History   Socioeconomic History   Marital status: Married  Spouse name: Not on file   Number of children: Not on file   Years of education: Not on file   Highest education level: Not on file  Occupational History   Not on file  Tobacco Use   Smoking status: Never   Smokeless tobacco: Never  Vaping Use   Vaping status: Never Used  Substance and Sexual Activity   Alcohol use: Yes    Alcohol/week: 0.0 - 4.0 standard drinks of alcohol   Drug use: No   Sexual activity: Yes    Partners: Male    Birth control/protection: None    Comment: husband had vasectomy   Other Topics Concern   Not on file  Social History Narrative   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      Home Situation: Lives with husband      Spiritual  Beliefs: Christian      Lifestyle: exercises at the gym 4 days per week; diet is healthy      Social Drivers of Health   Tobacco Use: Low Risk (05/01/2024)   Patient History    Smoking Tobacco Use: Never    Smokeless Tobacco Use: Never    Passive Exposure: Not on file  Financial Resource Strain: Not on file  Food Insecurity: Not on file  Transportation Needs: Not on file  Physical Activity: Not on file  Stress: Not on file  Social Connections: Not on file  Intimate Partner Violence: Not on file  Depression (PHQ2-9): Low Risk (05/01/2024)   Depression (PHQ2-9)    PHQ-2 Score: 0  Alcohol Screen: Not on file  Housing: Not on file  Utilities: Not on file  Health Literacy: Not on file    Family History  Problem Relation Age of Onset   Hearing loss Mother    Osteoporosis Mother    Miscarriages / Stillbirths Mother    Prostate cancer Father 28   Colon polyps Father 80   Arthritis Father    Hyperlipidemia Father    Diabetes Father    Hypertension Father    Heart attack Father    Breast cancer Sister 73   Thyroid  cancer Sister 40   Depression Sister    Alcohol abuse Sister    Asthma Sister    Depression Brother    Alcohol abuse Brother    Mental illness Brother    Heart failure Maternal Uncle    Pancreatic cancer Paternal Aunt 69   Colon cancer Paternal Uncle    Prostate cancer Paternal Uncle 50       aggressive, died from this cancer about 1 year later   Prostate cancer Paternal Uncle 77       aggressive, died from this cancer   Pulmonary disease Maternal Grandmother    Lung cancer Maternal Grandfather    Prostate cancer Paternal Grandfather 21   Skin cancer Cousin     Allergies  Allergen Reactions   Poison Ivy Extract [Poison Ivy Extract] Rash      PE Today's Vitals   05/01/24 1124  BP: 100/64  Pulse: 78  Temp: 98.2 F (36.8 C)  TempSrc: Oral  SpO2: 97%  Weight: 167 lb (75.8 kg)  Height: 5' 7 (1.702 m)   Body mass index is 26.16 kg/m.  Physical  Exam Vitals reviewed. Exam conducted with a chaperone present.  Constitutional:      General: She is not in acute distress.    Appearance: Normal appearance.  HENT:     Head: Normocephalic and atraumatic.  Nose: Nose normal.  Eyes:     Extraocular Movements: Extraocular movements intact.     Conjunctiva/sclera: Conjunctivae normal.  Pulmonary:     Effort: Pulmonary effort is normal.  Chest:     Chest wall: No mass or tenderness.  Breasts:    Right: Normal. No swelling, mass, nipple discharge, skin change or tenderness.     Left: Normal. No swelling, mass, nipple discharge, skin change or tenderness.     Comments: Bilateral implants Abdominal:     General: There is no distension.     Palpations: Abdomen is soft.     Tenderness: There is no abdominal tenderness.  Genitourinary:    General: Normal vulva.     Exam position: Lithotomy position.     Urethra: No prolapse.     Vagina: Normal. No vaginal discharge or bleeding.     Cervix: Normal. No lesion.     Uterus: Normal. Not enlarged and not tender.      Adnexa: Right adnexa normal and left adnexa normal.  Musculoskeletal:        General: Normal range of motion.  Lymphadenopathy:     Upper Body:     Right upper body: No axillary adenopathy.     Left upper body: No axillary adenopathy.     Lower Body: No right inguinal adenopathy. No left inguinal adenopathy.  Skin:    General: Skin is warm and dry.  Neurological:     General: No focal deficit present.     Mental Status: She is alert.  Psychiatric:        Mood and Affect: Mood normal.        Behavior: Behavior normal.     Assessment and Plan:        Well woman exam with routine gynecological exam Assessment & Plan: Encouraged strength training 2-3x/wk Cervical cancer screening performed according to ASCCP guidelines. Encouraged annual mammogram screening Colonoscopy UTD DXA N/A Labs and immunizations with her primary Encouraged safe sexual practices as  indicated Encouraged healthy lifestyle practices with diet and exercise For patients under 50-70yo, I recommend 1200mg  calcium daily and 600IU of vitamin D  daily.    Negative depression screening  Vera LULLA Pa, MD  "

## 2024-05-01 NOTE — Assessment & Plan Note (Signed)
 Cervical cancer screening performed according to ASCCP guidelines. Encouraged annual mammogram screening Colonoscopy UTD DXA N/A Labs and immunizations with her primary Encouraged safe sexual practices as indicated Encouraged healthy lifestyle practices with diet and exercise For patients under 50-54yo, I recommend 1200mg  calcium daily and 600IU of vitamin D daily.

## 2024-06-25 ENCOUNTER — Ambulatory Visit

## 2025-05-05 ENCOUNTER — Ambulatory Visit: Admitting: Obstetrics and Gynecology
